# Patient Record
Sex: Female | Born: 1960 | Race: White | Hispanic: No | Marital: Married | Smoking: Never smoker
Health system: Southern US, Community
[De-identification: ages and names within clinical notes are randomized; demographics above are authoritative.]

---

## 1998-06-10 HISTORY — PX: BREAST SURGERY: SHX581

## 2003-06-11 HISTORY — PX: ENDOMETRIAL ABLATION: SHX621

## 2006-08-04 ENCOUNTER — Inpatient Hospital Stay: Payer: Self-pay | Admitting: Unknown Physician Specialty

## 2006-08-04 ENCOUNTER — Other Ambulatory Visit: Payer: Self-pay

## 2009-04-12 ENCOUNTER — Ambulatory Visit: Payer: Self-pay | Admitting: Family Medicine

## 2009-11-09 ENCOUNTER — Ambulatory Visit: Payer: Self-pay | Admitting: Obstetrics and Gynecology

## 2009-11-16 ENCOUNTER — Ambulatory Visit: Payer: Self-pay | Admitting: Obstetrics and Gynecology

## 2009-11-24 ENCOUNTER — Ambulatory Visit: Payer: Self-pay | Admitting: Obstetrics and Gynecology

## 2009-11-30 ENCOUNTER — Ambulatory Visit: Payer: Self-pay | Admitting: Obstetrics and Gynecology

## 2012-05-27 ENCOUNTER — Ambulatory Visit: Payer: Self-pay | Admitting: Orthopedic Surgery

## 2012-05-29 ENCOUNTER — Ambulatory Visit: Payer: Self-pay | Admitting: Obstetrics and Gynecology

## 2013-04-29 IMAGING — MG MM CAD SCREENING MAMMO
1 series · 4 of 4 positions shown · non-contrast
Comparison: none

REASON FOR EXAM: SCR MAMMO NO ORDER
COMMENTS:

[R CC · right · 4 of 4 slices shown]
[im 1/4]
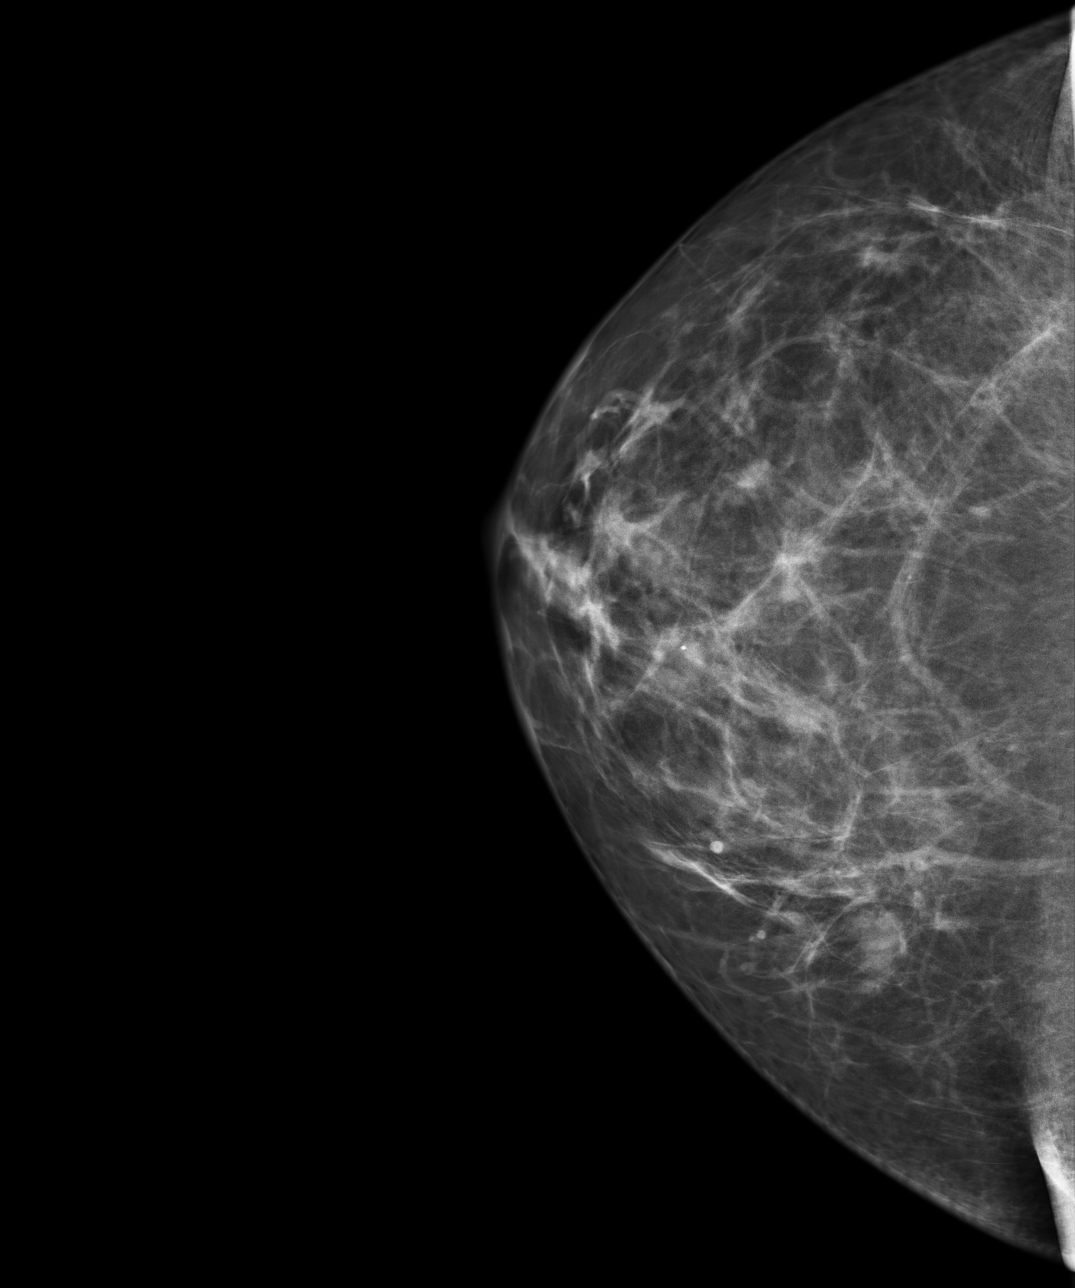
[im 2/4]
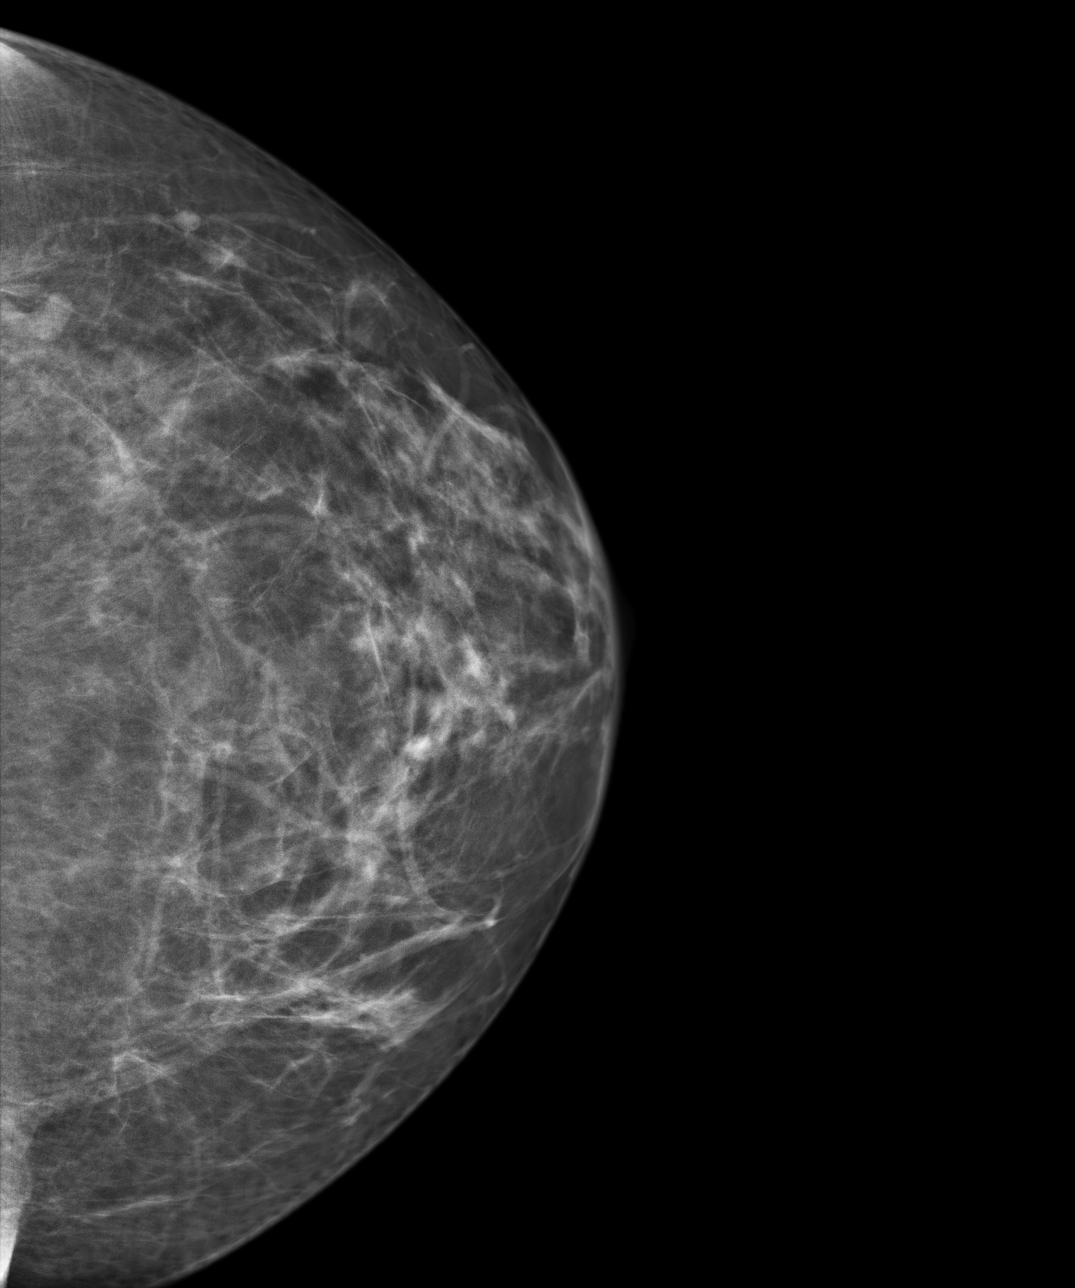
[im 3/4]
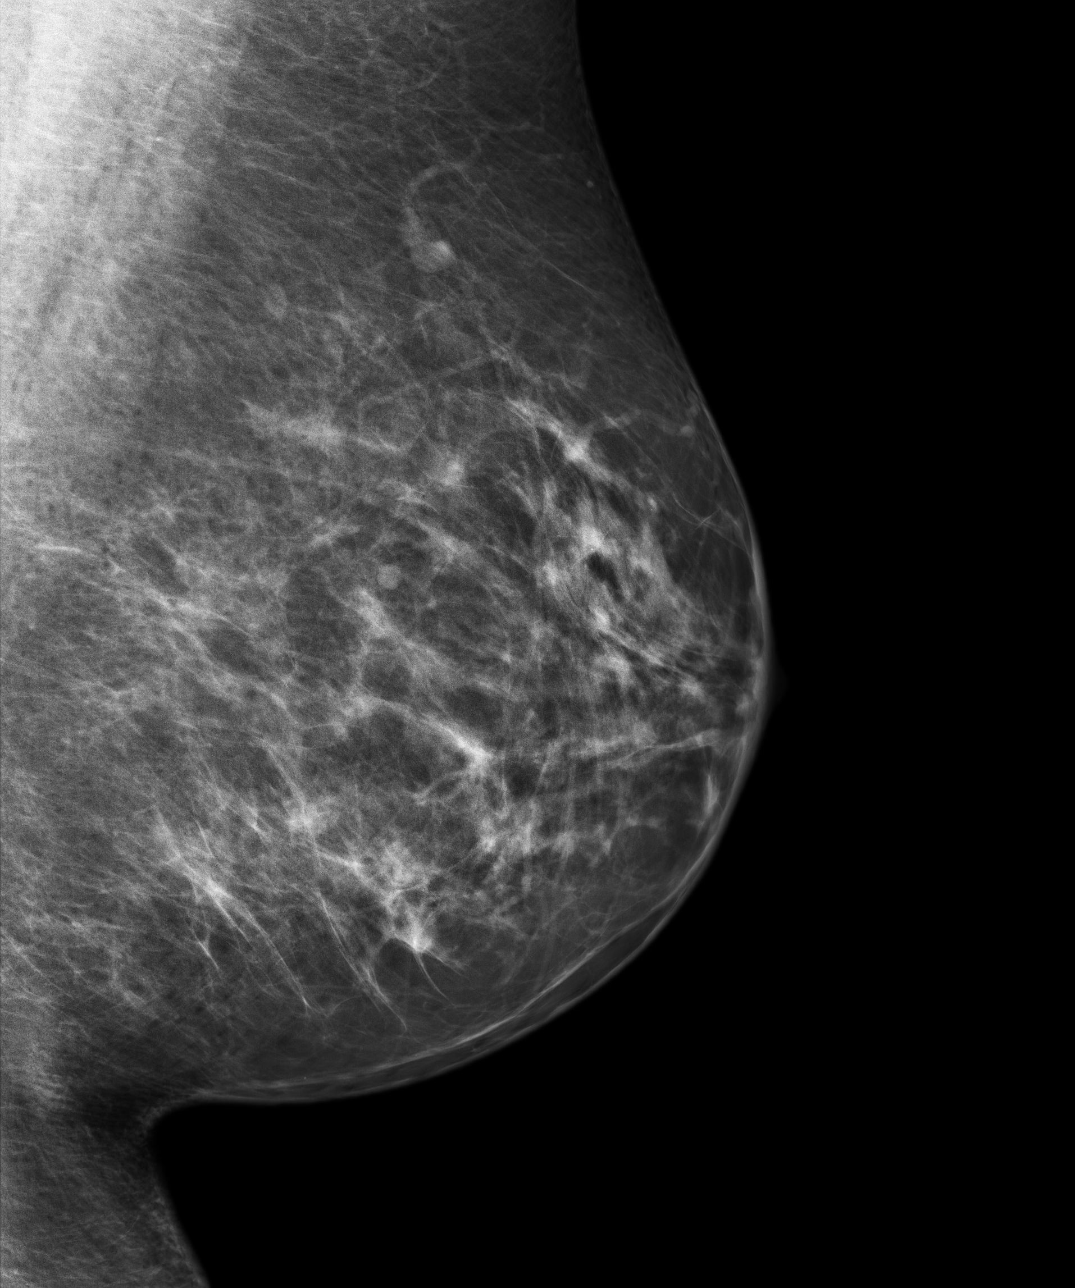
[im 4/4]
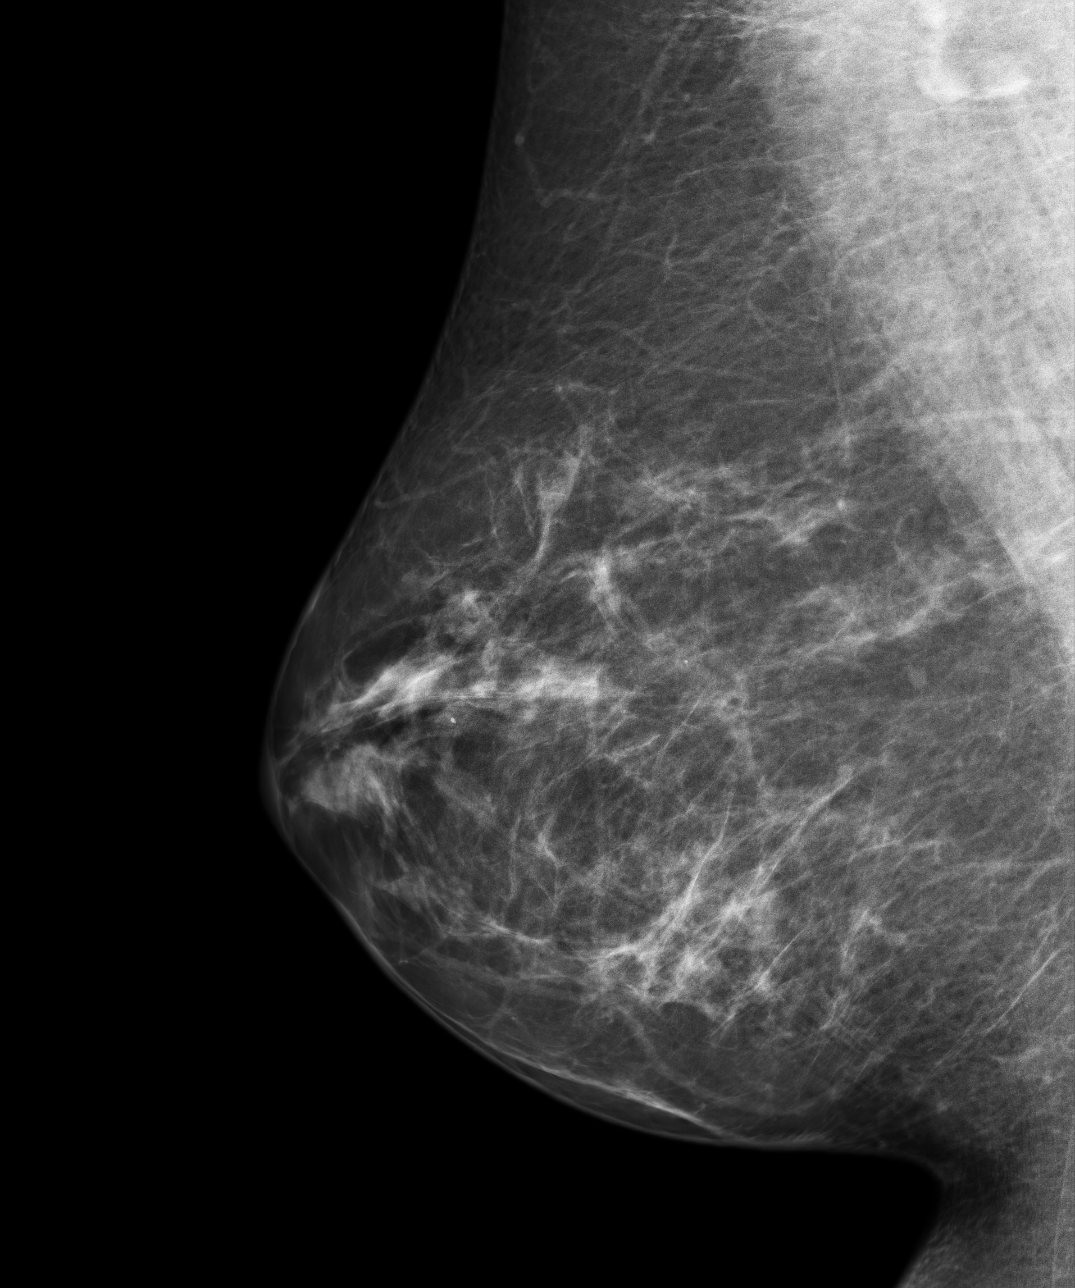

[4 of 4 positions shown; findings below may reference images not displayed]

PROCEDURE:     MAM - MAM DGTL SCRN MAM NO ORDER W/CAD  - May 29, 2012 [DATE]

RESULT:     There is a family history of breast cancer in the patient's
mother and two maternal aunts. The patient has undergone breast reduction in
5555. Comparison is made to the previous analog images dated 16 March, 2001
and to digital mammographic images from 12 April, 2009, as well as 20 November, 2005. There is a moderately dense heterogeneous parenchymal pattern without
a dominant mass, malignant calcification or architectural distortion. The
mammographic appearance is stable.
IMPRESSION: 1. Stable, benign appearing bilateral mammogram. Please continue to
encourage annual mammographic follow-up and monthly breast self exam.

Breast density: BI-RADS type 2

BI-RADS: Category 2 - Benign Finding.

A NEGATIVE MAMMOGRAM REPORT DOES NOT PRECLUDE BIOPSY OR OTHER EVALUATION OF
A CLINICALLY PALPABLE OR OTHERWISE SUSPICIOUS MASS OR LESION. BREAST CANCER
MAY NOT BE DETECTED BY MAMMOGRAPHY IN UP TO 10% OF CASES.

[REDACTED]

## 2014-04-04 ENCOUNTER — Ambulatory Visit: Payer: Self-pay | Admitting: Podiatry

## 2015-07-24 ENCOUNTER — Ambulatory Visit (INDEPENDENT_AMBULATORY_CARE_PROVIDER_SITE_OTHER): Payer: Medicare Other | Admitting: Sports Medicine

## 2015-07-24 ENCOUNTER — Encounter: Payer: Self-pay | Admitting: Sports Medicine

## 2015-07-24 DIAGNOSIS — S91209A Unspecified open wound of unspecified toe(s) with damage to nail, initial encounter: Secondary | ICD-10-CM

## 2015-07-24 DIAGNOSIS — M79674 Pain in right toe(s): Secondary | ICD-10-CM

## 2015-07-24 NOTE — Progress Notes (Deleted)
   Subjective:    Patient ID: Tiffany Price, female    DOB: 1960/07/12, 55 y.o.   MRN: [id number]  HPI    Review of Systems  All other systems reviewed and are negative.      Objective:   Physical Exam        Assessment & Plan:

## 2015-07-24 NOTE — Patient Instructions (Signed)

## 2015-07-24 NOTE — Progress Notes (Signed)
Patient ID: Tiffany Price, female   DOB: [birth year], 55 y.o.   MRN: [id number] Subjective: Tiffany Price is a 55 y.o.  female patient presents to office today complaining of a painful right hallux toenail that is coming off; reports that on yesterday she accidentally snagged the nail after her sons had stepped on her toe; states that she has had problems with her nail since 2012 after getting a pedicure and the nail always has been brown to grey in color. Patient denies fever/chills/nausea/vomitting/any other related constitutional symptoms at this time.  There are no active problems to display for this patient.  No current outpatient prescriptions on file prior to visit.   No current facility-administered medications on file prior to visit.   Allergies  Allergen Reactions  . Cymbalta [Duloxetine Hcl] Other (See Comments)  . Tetracyclines & Related   . Latex     Internally       Objective:   General: Well developed, nourished, in no acute distress, alert and oriented x3   Dermatology: Skin is warm, dry and supple bilateral.Right hallux nail appears to be partially attached with thickening, discoloration, and subungal debris. (-) Erythema. (-) Edema. (-) serosanguous drainage present. The remaining nails appear unremarkable at this time. There are no open sores, lesions or other signs of infection  present.  Vascular: Dorsalis Pedis artery 2/4 and Posterior Tibial artery pedal pulses are 1/4 bilateral with immedate capillary fill time. Pedal hair growth present. No lower extremity edema.   Neruologic: Grossly intact via light touch bilateral.  Musculoskeletal: Tenderness to palpation of the Right hallux. Muscular strength within normal limits in all groups bilateral.   Assesement and Plan: Problem List Items Addressed This Visit    None    Visit Diagnoses    Toenail avulsion, initial encounter    -  Primary    Partially avulsed Right hallux toenail after traumatically pulling  nail off    Toe pain, right           -Discussed treatment alternatives and plan of care; Explained permanent/temporary nail avulsion and post procedure course to patient. Patient opt for temporary complete avulsion of the right hallux nail.  - After a verbal consent, injected 3 ml of a 50:50 mixture of 2% plain lidocaine and 0.5% plain marcaine in a normal hallux block fashion. Next, a betadine prep was performed. Anesthesia was tested and found to be appropriate.  The Right hallux nail was then incised from the hyponychium to the epinychium. The entire nail was removed and cleared from the field. The area was then flushed with alcohol and dressed with antibiotic cream and a dry sterile dressing. -Patient was instructed to leave the dressing intact for today and begin soaking in a weak solution of betadine and water tomorrow. Patient was instructed to soak for 15 minutes each day and apply neosporin and a gauze or bandaid dressing each day. -Patient was instructed to monitor the toe for signs of infection and return to office if toe becomes red, hot or swollen. -Patient is to return in 1 week for follow up care or sooner if problems arise.  Landis Martins, DPM

## 2015-08-01 ENCOUNTER — Ambulatory Visit (INDEPENDENT_AMBULATORY_CARE_PROVIDER_SITE_OTHER): Payer: Medicare Other | Admitting: Sports Medicine

## 2015-08-01 ENCOUNTER — Encounter: Payer: Self-pay | Admitting: Sports Medicine

## 2015-08-01 DIAGNOSIS — M79674 Pain in right toe(s): Secondary | ICD-10-CM

## 2015-08-01 DIAGNOSIS — S91209D Unspecified open wound of unspecified toe(s) with damage to nail, subsequent encounter: Secondary | ICD-10-CM

## 2015-08-01 NOTE — Progress Notes (Signed)
Patient ID: Tiffany Price, female   DOB: August 22, 1960, 55 y.o.   MRN: [id number] Subjective: Tiffany Price is a 55 y.o.  female patient returns to office today for follow up evaluation after having Right Hallux total temporary nail avulsion performed on 07-24-15 secondary to snagging the nail and it partially coming off. Patient has been soaking using betadine and applying topical antibiotic covered with bandaid daily. Admits to pain with direct pressure. Patient deniesfever/chills/nausea/vomitting/any other related constitutional symptoms at this time.  There are no active problems to display for this patient.  Current Outpatient Prescriptions on File Prior to Visit  Medication Sig Dispense Refill  . amphetamine-dextroamphetamine (ADDERALL) 20 MG tablet Take 20 mg by mouth 4 (four) times daily.  0  . clonazePAM (KLONOPIN) 2 MG tablet TAKE 1 TABLET BY MOUTH 3 TIMES DAY AS NEEDED  2  . cyclobenzaprine (FLEXERIL) 10 MG tablet Take 10 mg by mouth 2 (two) times daily.  0  . phentermine (ADIPEX-P) 37.5 MG tablet     . tinidazole (TINDAMAX) 500 MG tablet Take 2,000 mg by mouth daily.  0  . topiramate (TOPAMAX) 200 MG tablet     . TRANSDERM-SCOP, 1.5 MG, 1 MG/3DAYS APPLY 1 PATCH TO SKIN EVERY 3 DAYS AS DIRECTED  0   No current facility-administered medications on file prior to visit.   Allergies  Allergen Reactions  . Cymbalta [Duloxetine Hcl] Other (See Comments)  . Tetracyclines & Related   . Latex     Internally      Objective:  General: Well developed, nourished, in no acute distress, alert and oriented x3   Dermatology: Skin is warm, dry and supple bilateral. Right hallux nail bed appears to be clean, dry, with mild granular tissue and surrounding eschar/scab. (-) Erythema. (-) Edema. (-) serosanguous drainage present. The remaining nails appear unremarkable at this time. There are no other lesions or other signs of infection present.  Neurovascular status: Intact. No lower extremity  swelling; No pain with calf compression bilateral.  Musculoskeletal: Decreased tenderness to palpation of the right hallux nail bed. Muscular strength within normal limits bilateral.   Assesement and Plan: Problem List Items Addressed This Visit    None    Visit Diagnoses    Toenail avulsion, subsequent encounter    -  Primary    S/P Avuslion procedure 07-24-15, healing well without complication    Toe pain, right           -Examined patient  -Cleansed right hallux nail bed and gently scrubbed with peroxide and applied antibiotic cream covered with bandaid.  -Discussed plan of care with patient. -Patient to now begin soaking in a weak solution of Epsom salt and warm water. Patient was instructed to soak for 15-20 minutes each day until the toe appears normal and there is no drainage, redness, tenderness, or swelling at the procedure site, and apply neosporin and a gauze or bandaid dressing each day as needed. May leave open to air at night. -Educated patient on long term care after nail surgery. -Patient was instructed to monitor the toe for reoccurrence and signs of infection; Patient advised to return to office if toe becomes red, hot or swollen. -Patient is to return as needed or sooner if problems arise.  Landis Martins, DPM

## 2015-08-04 ENCOUNTER — Ambulatory Visit: Payer: Medicare Other | Admitting: Sports Medicine

## 2017-04-21 ENCOUNTER — Encounter: Payer: Medicare Other | Admitting: Obstetrics & Gynecology

## 2017-05-06 ENCOUNTER — Encounter: Payer: Medicare Other | Admitting: Obstetrics & Gynecology

## 2017-05-13 ENCOUNTER — Encounter: Payer: Medicare Other | Admitting: Obstetrics & Gynecology

## 2017-09-03 ENCOUNTER — Ambulatory Visit: Payer: Medicare Other | Admitting: Podiatry

## 2017-10-21 DIAGNOSIS — F988 Other specified behavioral and emotional disorders with onset usually occurring in childhood and adolescence: Secondary | ICD-10-CM | POA: Insufficient documentation

## 2017-10-21 DIAGNOSIS — M5030 Other cervical disc degeneration, unspecified cervical region: Secondary | ICD-10-CM | POA: Insufficient documentation

## 2017-10-21 DIAGNOSIS — D4959 Neoplasm of unspecified behavior of other genitourinary organ: Secondary | ICD-10-CM | POA: Insufficient documentation

## 2017-10-21 DIAGNOSIS — E785 Hyperlipidemia, unspecified: Secondary | ICD-10-CM | POA: Insufficient documentation

## 2017-10-21 DIAGNOSIS — E669 Obesity, unspecified: Secondary | ICD-10-CM | POA: Insufficient documentation

## 2017-10-21 DIAGNOSIS — F419 Anxiety disorder, unspecified: Secondary | ICD-10-CM | POA: Insufficient documentation

## 2017-10-21 DIAGNOSIS — F3130 Bipolar disorder, current episode depressed, mild or moderate severity, unspecified: Secondary | ICD-10-CM | POA: Insufficient documentation

## 2017-10-21 DIAGNOSIS — Z780 Asymptomatic menopausal state: Secondary | ICD-10-CM | POA: Insufficient documentation

## 2018-09-04 ENCOUNTER — Other Ambulatory Visit: Payer: Self-pay

## 2018-09-08 ENCOUNTER — Ambulatory Visit: Payer: Medicare Other | Admitting: Obstetrics & Gynecology

## 2018-09-08 ENCOUNTER — Telehealth: Payer: Self-pay | Admitting: *Deleted

## 2018-09-08 ENCOUNTER — Other Ambulatory Visit: Payer: Self-pay

## 2018-09-08 ENCOUNTER — Encounter: Payer: Self-pay | Admitting: Obstetrics & Gynecology

## 2018-09-08 VITALS — BP 130/90 | Ht 66.0 in | Wt 226.0 lb

## 2018-09-08 DIAGNOSIS — Z803 Family history of malignant neoplasm of breast: Secondary | ICD-10-CM | POA: Diagnosis not present

## 2018-09-08 DIAGNOSIS — Z1151 Encounter for screening for human papillomavirus (HPV): Secondary | ICD-10-CM

## 2018-09-08 DIAGNOSIS — E6609 Other obesity due to excess calories: Secondary | ICD-10-CM | POA: Diagnosis not present

## 2018-09-08 DIAGNOSIS — Z01419 Encounter for gynecological examination (general) (routine) without abnormal findings: Secondary | ICD-10-CM | POA: Diagnosis not present

## 2018-09-08 DIAGNOSIS — Z6836 Body mass index (BMI) 36.0-36.9, adult: Secondary | ICD-10-CM

## 2018-09-08 DIAGNOSIS — E66812 Obesity, class 2: Secondary | ICD-10-CM

## 2018-09-08 DIAGNOSIS — N951 Menopausal and female climacteric states: Secondary | ICD-10-CM

## 2018-09-08 DIAGNOSIS — Z1211 Encounter for screening for malignant neoplasm of colon: Secondary | ICD-10-CM

## 2018-09-08 MED ORDER — BUPROPION HCL ER (XL) 150 MG PO TB24: 150.0000 mg | ORAL_TABLET | Freq: Every day | ORAL | 4 refills | Status: DC

## 2018-09-08 NOTE — Addendum Note (Signed)
Addended by: Thurnell Garbe A on: 09/08/2018 01:03 PM   Modules accepted: Orders

## 2018-09-08 NOTE — Telephone Encounter (Signed)
Referral placed in epic for both referral.   1. [facility] they will call to schedule 2. [facility] will call to schedule

## 2018-09-08 NOTE — Telephone Encounter (Signed)
-----   Message from Princess Bruins, MD sent at 09/08/2018 12:22 PM EDT ----- Regarding: Refer to Gastro/Refer to [facility] 1st screening [place].  Mother with Breast Cancer at 56+ yo.

## 2018-09-08 NOTE — Patient Instructions (Signed)
1. Encounter for routine gynecological examination with Papanicolaou smear of cervix Normal gynecologic exam.  Pap with high-risk HPV done today.  Breast exam normal.  Will schedule screening mammogram.  Refer to gastro for first screening colonoscopy.  Health labs with family physician.  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  Mother with breast cancer in her 57s, no genetic testing done.  Decision to start on Wellbutrin ER 150 mg daily to decrease the menopausal symptoms.  Usage reviewed and prescription sent to pharmacy.  Refer to [facility] genetic counseling.    3. Family history of breast cancer in mother Refer to [facility] genetic counseling.  4. Class 2 obesity due to excess calories without serious comorbidity with body mass index (BMI) of 36.0 to 36.9 in adult Recommend lower calorie/carb diet such as [employer] combined with intermittent fasting.  Counseling on intermittent fasting done.  Aerobic physical activities 5 times a week and weightlifting every 2 days.  Other orders - buPROPion (WELLBUTRIN XL) 150 MG 24 hr tablet; Take 1 tablet (150 mg total) by mouth daily.  Tiffany Price, it was a pleasure seeing you today!  I will inform you of your results as soon as they are available.

## 2018-09-08 NOTE — Progress Notes (Signed)
Tiffany Price 09-09-1960 [id number]   History:    58 y.o. [id number]  Divorcing.  Stable boyfriend for 3 years.  RP:  Established patient presenting for annual gyn exam   HPI: Menopause since 2017, very symptomatic with hot flashes and night sweats preventing a good night sleep.  No postmenopausal bleeding.  No pelvic pain.  Mother with history of breast cancer in her 72s.  No genetic screening done.  Breast normal.  No pain with intercourse.  Declines STD screening.  Body mass index 36.48.  Walking regularly.  Established with a family physician.  Health labs with family physician.  Followed by psychiatrist for bipolar disorder and anxiety.   Past medical history,surgical history, family history and social history were all reviewed and documented in the EPIC chart.  Gynecologic History No LMP recorded (lmp unknown). Patient is postmenopausal. Contraception: post menopausal status Last Pap: 12/2014. Results were: ASCUS/HPV HR neg Last mammogram: 12/2014.  Results were: Benign findings Bone Density: Never Colonoscopy: Will refer to Gastro  Obstetric History OB History  Gravida Para Term Preterm AB Living  5 2     3 2   SAB TAB Ectopic Multiple Live Births  3            # Outcome Date GA Lbr Len/2nd Weight Sex Delivery Anes PTL Lv  5 SAB           4 SAB           3 SAB           2 Para           1 Para              ROS: A ROS was performed and pertinent positives and negatives are included in the history.  GENERAL: No fevers or chills. HEENT: No change in vision, no earache, sore throat or sinus congestion. NECK: No pain or stiffness. CARDIOVASCULAR: No chest pain or pressure. No palpitations. PULMONARY: No shortness of breath, cough or wheeze. GASTROINTESTINAL: No abdominal pain, nausea, vomiting or diarrhea, melena or bright red blood per rectum. GENITOURINARY: No urinary frequency, urgency, hesitancy or dysuria. MUSCULOSKELETAL: No joint or muscle pain, no back pain, no recent  trauma. DERMATOLOGIC: No rash, no itching, no lesions. ENDOCRINE: No polyuria, polydipsia, no heat or cold intolerance. No recent change in weight. HEMATOLOGICAL: No anemia or easy bruising or bleeding. NEUROLOGIC: No headache, seizures, numbness, tingling or weakness. PSYCHIATRIC: No depression, no loss of interest in normal activity or change in sleep pattern.     Exam:   BP 130/90   Ht 5\' 6"  (1.676 m)   Wt 226 lb (102.5 kg)   LMP  (LMP Unknown)   BMI 36.48 kg/m   Body mass index is 36.48 kg/m.  General appearance : Well developed well nourished female. No acute distress HEENT: Eyes: no retinal hemorrhage or exudates,  Neck supple, trachea midline, no carotid bruits, no thyroidmegaly Lungs: Clear to auscultation, no rhonchi or wheezes, or rib retractions  Heart: Regular rate and rhythm, no murmurs or gallops Breast:Examined in sitting and supine position were symmetrical in appearance, no palpable masses or tenderness,  no skin retraction, no nipple inversion, no nipple discharge, no skin discoloration, no axillary or supraclavicular lymphadenopathy Abdomen: no palpable masses or tenderness, no rebound or guarding Extremities: no edema or skin discoloration or tenderness  Pelvic: Vulva: Normal             Vagina: No gross lesions or discharge  Cervix: No gross lesions or discharge.  Pap/HPV HR done.  Uterus  AV, normal size, shape and consistency, non-tender and mobile  Adnexa  Without masses or tenderness  Anus: Normal   Assessment/Plan:  58 y.o. female for annual exam   1. Encounter for routine gynecological examination with Papanicolaou smear of cervix Normal gynecologic exam.  Pap with high-risk HPV done today.  Breast exam normal.  Will schedule screening mammogram.  Refer to gastro for first screening colonoscopy.  Health labs with family physician.  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   Mother with breast cancer in her 68s, no genetic testing done.  Decision to start on Wellbutrin ER 150 mg daily to decrease the menopausal symptoms.  Usage reviewed and prescription sent to pharmacy.  Refer to [facility] genetic counseling.    3. Family history of breast cancer in mother Refer to [facility] genetic counseling.  4. Class 2 obesity due to excess calories without serious comorbidity with body mass index (BMI) of 36.0 to 36.9 in adult Recommend lower calorie/carb diet such as [employer] combined with intermittent fasting.  Counseling on intermittent fasting done.  Aerobic physical activities 5 times a week and weightlifting every 2 days.  Other orders - buPROPion (WELLBUTRIN XL) 150 MG 24 hr tablet; Take 1 tablet (150 mg total) by mouth daily.  Princess Bruins MD, 12:06 PM 09/08/2018

## 2018-09-09 ENCOUNTER — Telehealth: Payer: Self-pay | Admitting: Licensed Clinical Social Worker

## 2018-09-09 ENCOUNTER — Encounter: Payer: Self-pay | Admitting: Licensed Clinical Social Worker

## 2018-09-09 NOTE — Telephone Encounter (Signed)
A genetic counseling appt has been scheduled for the pt to see Faith Rogue on 6/18 at 1pm. Letter mailed.

## 2018-09-10 LAB — PAP, TP IMAGING W/ HPV RNA, RFLX HPV TYPE 16,18/45: HPV DNA HIGH RISK: NOT DETECTED

## 2018-09-15 NOTE — Telephone Encounter (Signed)
2. Patient scheduled on 11/26/18 @ 1:00pm

## 2018-11-06 ENCOUNTER — Telehealth: Payer: Self-pay | Admitting: *Deleted

## 2018-11-06 NOTE — Telephone Encounter (Signed)
Please relay the info from the counselor to patient.  We could do the genetic labs without the counseling.

## 2018-11-06 NOTE — Telephone Encounter (Signed)
Ok, thanks for the info.  Dr Dellis Filbert

## 2018-11-06 NOTE — Telephone Encounter (Signed)
Patient called and canceled her genetics appt, patient's insurance will not cover the appts. . Explained that I would send the genetic nurse a message.

## 2018-11-26 ENCOUNTER — Encounter: Payer: Self-pay | Admitting: Licensed Clinical Social Worker

## 2018-11-26 ENCOUNTER — Other Ambulatory Visit: Payer: Self-pay

## 2019-08-10 ENCOUNTER — Ambulatory Visit: Payer: Medicare Other | Admitting: Sports Medicine

## 2019-09-09 ENCOUNTER — Encounter: Payer: Medicare Other | Admitting: Obstetrics & Gynecology

## 2019-10-18 ENCOUNTER — Encounter: Payer: Self-pay | Admitting: Podiatry

## 2019-10-18 ENCOUNTER — Other Ambulatory Visit: Payer: Self-pay

## 2019-10-18 ENCOUNTER — Ambulatory Visit: Payer: Medicare PPO | Admitting: Podiatry

## 2019-10-18 DIAGNOSIS — L603 Nail dystrophy: Secondary | ICD-10-CM

## 2019-10-18 NOTE — Progress Notes (Signed)
  Subjective:  Patient ID: Tiffany Price, female    DOB: [birth year],  MRN: [id number] HPI Chief Complaint  Patient presents with  . Nail Problem    Hallux right - toenail discoloration since Oct. 2020, has been treated for nail fungus in the past and Dr. Cannon Kettle has removed the toenail from an injury before, tender at times and thinks may be just a bruise under the nail  . New Patient (Initial Visit)    Est pt 2017    59 y.o. female presents with the above complaint.   ROS: Denies fever chills nausea vomiting muscle aches pains calf pain back pain chest pain shortness of breath.  No past medical history on file. Past Surgical History:  Procedure Laterality Date  . BREAST SURGERY  2000   breast reduction  . ENDOMETRIAL ABLATION  2005    Current Outpatient Medications:  .  medroxyPROGESTERone (PROVERA) 2.5 MG tablet, Take 2.5 mg by mouth daily., Disp: , Rfl:  .  amphetamine-dextroamphetamine (ADDERALL) 20 MG tablet, , Disp: , Rfl:  .  clonazePAM (KLONOPIN) 2 MG tablet, , Disp: , Rfl:  .  estradiol (ESTRACE) 1 MG tablet, , Disp: , Rfl:  .  phentermine 37.5 MG capsule, , Disp: , Rfl:  .  topiramate (TOPAMAX) 200 MG tablet, , Disp: , Rfl:   Allergies  Allergen Reactions  . Cymbalta [Duloxetine Hcl] Other (See Comments)  . Tetracyclines & Related   . Latex     Internally    . Saphris [Asenapine] Other (See Comments)   Review of Systems Objective:  There were no vitals filed for this visit.  General: Well developed, nourished, in no acute distress, alert and oriented x3   Dermatological: Skin is warm, dry and supple bilateral. Nails x 10 are well maintained; remaining integument appears unremarkable at this time. There are no open sores, no preulcerative lesions, no rash or signs of infection present.  Discoloration to the distal medial aspect of the right hallux nail plate.  Dark brown and yellow with some thickening and subungual debris.  Vascular: Dorsalis Pedis artery and  Posterior Tibial artery pedal pulses are 2/4 bilateral with immedate capillary fill time. Pedal hair growth present. No varicosities and no lower extremity edema present bilateral.   Neruologic: Grossly intact via light touch bilateral. Vibratory intact via tuning fork bilateral. Protective threshold with Semmes Wienstein monofilament intact to all pedal sites bilateral. Patellar and Achilles deep tendon reflexes 2+ bilateral. No Babinski or clonus noted bilateral.   Musculoskeletal: No gross boney pedal deformities bilateral. No pain, crepitus, or limitation noted with foot and ankle range of motion bilateral. Muscular strength 5/5 in all groups tested bilateral.  Gait: Unassisted, Nonantalgic.    Radiographs:  None taken  Assessment & Plan:   Assessment: Nail dystrophy cannot rule out onychomycosis hallux right  Plan: Samples of skin and nail were taken today for pathologic evaluation follow-up with her in 1 month     Mirenda Baltazar T. [place], [place]

## 2019-11-04 ENCOUNTER — Telehealth: Payer: Self-pay

## 2019-11-04 NOTE — Telephone Encounter (Signed)
Patient has been notified of results.  

## 2019-11-04 NOTE — Telephone Encounter (Signed)
-----   Message from Garrel Ridgel, [place] sent at 11/01/2019  7:29 AM EDT ----- Positive for fungus.

## 2019-11-29 ENCOUNTER — Other Ambulatory Visit: Payer: Self-pay

## 2019-11-29 ENCOUNTER — Ambulatory Visit: Payer: Medicare PPO | Admitting: Podiatry

## 2019-11-29 ENCOUNTER — Encounter: Payer: Self-pay | Admitting: Podiatry

## 2019-11-29 DIAGNOSIS — L603 Nail dystrophy: Secondary | ICD-10-CM

## 2019-11-29 DIAGNOSIS — Z79899 Other long term (current) drug therapy: Secondary | ICD-10-CM | POA: Diagnosis not present

## 2019-11-29 MED ORDER — TERBINAFINE HCL 250 MG PO TABS: 250.0000 mg | ORAL_TABLET | Freq: Every day | ORAL | 0 refills | Status: DC

## 2019-11-29 NOTE — Patient Instructions (Signed)
Terbinafine tablets What is this medicine? TERBINAFINE (TER bin a feen) is an antifungal medicine. It is used to treat certain kinds of fungal or yeast infections. This medicine may be used for other purposes; ask your health care provider or pharmacist if you have questions. COMMON BRAND NAME(S): Lamisil, Terbinex What should I tell my health care provider before I take this medicine? They need to know if you have any of these conditions:  drink alcoholic beverages  kidney disease  liver disease  an unusual or allergic reaction to terbinafine, other medicines, foods, dyes, or preservatives  pregnant or trying to get pregnant  breast-feeding How should I use this medicine? Take this medicine by mouth with a full glass of water. Follow the directions on the prescription label. You can take this medicine with food or on an empty stomach. Take your medicine at regular intervals. Do not take your medicine more often than directed. Do not skip doses or stop your medicine early even if you feel better. Do not stop taking except on your doctor's advice. Talk to your pediatrician regarding the use of this medicine in children. Special care may be needed. Overdosage: If you think you have taken too much of this medicine contact a poison control center or emergency room at once. NOTE: This medicine is only for you. Do not share this medicine with others. What if I miss a dose? If you miss a dose, take it as soon as you can. If it is almost time for your next dose, take only that dose. Do not take double or extra doses. What may interact with this medicine? Do not take this medicine with any of the following medications:  thioridazine This medicine may also interact with the following medications:  beta-blockers  caffeine  cimetidine  cyclosporine  medicines for depression, anxiety, or psychotic disturbances  medicines for fungal infections like fluconazole and ketoconazole  medicines  for irregular heartbeat like amiodarone, flecainide and propafenone  rifampin  warfarin This list may not describe all possible interactions. Give your health care provider a list of all the medicines, herbs, non-prescription drugs, or dietary supplements you use. Also tell them if you smoke, drink alcohol, or use illegal drugs. Some items may interact with your medicine. What should I watch for while using this medicine? Visit your doctor or health care provider regularly. Tell your doctor right away if you have nausea or vomiting, loss of appetite, stomach pain on your right upper side, yellow skin, dark urine, light stools, or are over tired. Some fungal infections need many weeks or months of treatment to cure. If you are taking this medicine for a long time, you will need to have important blood work done. This medicine may cause serious skin reactions. They can happen weeks to months after starting the medicine. Contact your health care provider right away if you notice fevers or flu-like symptoms with a rash. The rash may be red or purple and then turn into blisters or peeling of the skin. Or, you might notice a red rash with swelling of the face, lips or lymph nodes in your neck or under your arms. What side effects may I notice from receiving this medicine? Side effects that you should report to your doctor or health care professional as soon as possible:  allergic reactions like skin rash or hives, swelling of the face, lips, or tongue  changes in vision  dark urine  fever or infection  general ill feeling or flu-like symptoms    light-colored stools  loss of appetite, nausea  rash, fever, and swollen lymph nodes  redness, blistering, peeling or loosening of the skin, including inside the mouth  right upper belly pain  unusually weak or tired  yellowing of the eyes or skin Side effects that usually do not require medical attention (report to your doctor or health care  professional if they continue or are bothersome):  changes in taste  diarrhea  hair loss  muscle or joint pain  stomach gas  stomach upset This list may not describe all possible side effects. Call your doctor for medical advice about side effects. You may report side effects to FDA at 1-800-FDA-1088. Where should I keep my medicine? Keep out of the reach of children. Store at room temperature below 25 degrees C (77 degrees F). Protect from light. Throw away any unused medicine after the expiration date. NOTE: This sheet is a summary. It may not cover all possible information. If you have questions about this medicine, talk to your doctor, pharmacist, or health care provider.  2020 Elsevier/Gold Standard (2018-09-04 15:37:07)  

## 2019-11-29 NOTE — Progress Notes (Signed)
She presents today for her pathology results regarding her toenails.  Objective: Vital signs are stable alert and oriented x3.  Pulses are palpable.  There is no erythema edema cellulitis drainage or odor no change in the nail plates as of yet.  However the pathology report does demonstrate onychomycosis.  Assessment: Onychomycosis dermatophyte.  Plan: After thorough discussion of the pros and cons of oral therapy topical therapy and laser therapy she has chosen to take oral therapy.  She is going to be started on Lamisil 250 mg tablets 1 tablet by mouth daily for 30 days.  We are requesting a liver profile and we will do another one in 30 days.  Should she have questions or concerns she will notify us immediately I will follow-up with her in 1 month.

## 2019-11-30 LAB — COMPREHENSIVE METABOLIC PANEL
ALT: 11 IU/L (ref 0–32)
AST: 13 IU/L (ref 0–40)
Albumin/Globulin Ratio: 2 (ref 1.2–2.2)
Albumin: 4.3 g/dL (ref 3.8–4.9)
Alkaline Phosphatase: 66 IU/L (ref 48–121)
BUN/Creatinine Ratio: 20 (ref 9–23)
BUN: 18 mg/dL (ref 6–24)
Bilirubin Total: 0.2 mg/dL (ref 0.0–1.2)
CO2: 20 mmol/L (ref 20–29)
Calcium: 9 mg/dL (ref 8.7–10.2)
Chloride: 110 mmol/L — ABNORMAL HIGH (ref 96–106)
Creatinine, Ser: 0.89 mg/dL (ref 0.57–1.00)
GFR calc Af Amer: 83 mL/min/{1.73_m2} (ref >59)
GFR calc non Af Amer: 72 mL/min/{1.73_m2} (ref >59)
Globulin, Total: 2.2 g/dL (ref 1.5–4.5)
Glucose: 108 mg/dL — ABNORMAL HIGH (ref 65–99)
Potassium: 4 mmol/L (ref 3.5–5.2)
Sodium: 140 mmol/L (ref 134–144)
Total Protein: 6.5 g/dL (ref 6.0–8.5)

## 2019-12-01 ENCOUNTER — Ambulatory Visit: Payer: Medicare PPO | Admitting: Dermatology

## 2019-12-01 ENCOUNTER — Encounter: Payer: Self-pay | Admitting: Dermatology

## 2019-12-01 ENCOUNTER — Other Ambulatory Visit: Payer: Self-pay

## 2019-12-01 DIAGNOSIS — T63304A Toxic effect of unspecified spider venom, undetermined, initial encounter: Secondary | ICD-10-CM

## 2019-12-01 DIAGNOSIS — L720 Epidermal cyst: Secondary | ICD-10-CM

## 2019-12-01 DIAGNOSIS — L719 Rosacea, unspecified: Secondary | ICD-10-CM | POA: Diagnosis not present

## 2019-12-01 MED ORDER — DOXYCYCLINE 40 MG PO CPDR: 40.0000 mg | DELAYED_RELEASE_CAPSULE | ORAL | 3 refills | Status: DC

## 2019-12-01 MED ORDER — SOOLANTRA 1 % EX CREA: TOPICAL_CREAM | CUTANEOUS | 3 refills | Status: DC

## 2019-12-01 NOTE — Patient Instructions (Addendum)
Pre-Operative Instructions  You are scheduled for a surgical procedure at [facility]. We recommend you read the following instructions. If you have any questions or concerns, please call the office at [phone].  1. Shower and wash the entire body with soap and water the day of your surgery paying special attention to cleansing at and around the planned surgery site.  2. Avoid aspirin or aspirin containing products at least fourteen (14) days prior to your surgical procedure and for at least one week (7 Days) after your surgical procedure. If you take aspirin on a regular basis for heart disease or history of stroke or for any other reason, we may recommend you continue taking aspirin but please notify us if you take this on a regular basis. Aspirin can cause more bleeding to occur during surgery as well as prolonged bleeding and bruising after surgery.   3. Avoid other nonsteroidal pain medications at least one week prior to surgery and at least one week prior to your surgery. These include medications such as Ibuprofen (Motrin, Advil and Nuprin), Naprosyn, Voltaren, Relafen, etc. If medications are used for therapeutic reasons, please inform us as they can cause increased bleeding or prolonged bleeding during and bruising after surgical procedures.   4. Please advice [place] if you are taking any "blood thinner" medications such as Coumadin or Dipyridamole or Plavix or similar medications. These cause increased bleeding and prolonged bleeding during and bruising after surgical procedures. We may have to consider discontinuing these medications briefly prior to and shortly after your surgery, if safe to do so.   5. Please inform us of all medications you are currently taking. All medications that are taken regularly should be taken the day of surgery as you always do. Nevertheless, we need to be informed of what medications you are taking prior to surgery to whether they will affect the  procedure or cause any complications.   6. Please inform us of any medication allergies. Also inform us of whether you have allergies to Latex or rubber products or whether you have had any adverse reaction to Lidocaine or Epinephrine.  7. Please inform us of any prosthetic or artificial body parts such as artificial heart valve, joint replacements, etc., or similar condition that might require preoperative antibiotics.   8. We recommend avoidance of alcohol at least two weeks prior to surgery and continued avoidence for at least two weeks after surgery.   9. We recommend discontinuation of tobacco smoking at least two weeks prior to surgery and continued abstinence for at least two weeks after surgery.  10. Do not plan strenuous exercise, strenuous work or strenuous lifting for approximately four weeks after your surgery.   11. We request if you are unable to make your scheduled surgical appointment, please call us at least a week in advance or as soon as you are aware of a problem sot aht we can cancel or reschedule you.   12. You MAKE TAKE TYLENOL (acetaminophen) for pain as it is not a blood thinner.   13. PLEASE PLAN TO BE IN TOWN FOR TWO WEEKS FOLLOWING SURGERY, THIS IS IMPORTANT SO YOU CAN BE CHECKED FOR DRESSING CHANGES, SUTURE REMOVAL AND TO MONITOR FOR POSSIBLE COMPLICATIONS.  Doxycycline should be taken with food to prevent nausea. Do not lay down for 30 minutes after taking. Be cautious with sun exposure and use good sun protection while on this medication. Pregnant women should not take this medication.

## 2019-12-01 NOTE — Progress Notes (Signed)
   Follow-Up Visit   Subjective  Tiffany Price is a 59 y.o. female who presents for the following: area of concern.  Patient presents today with several areas of concern: 1st rash on face, started on right cheek on B/L and on right neck since last March. 2nd is a cyst on her lower sternum x 1 year, it tends to leak and has a foul odor and gets irritated by her bra, and 3rd patient states that she has a spider bite on her left buttock x 2 days very painful to sit The following portions of the chart were reviewed this encounter and updated as appropriate:      Review of Systems:  No other skin or systemic complaints except as noted in HPI or Assessment and Plan.  Objective  Well appearing patient in no apparent distress; mood and affect are within normal limits.  A focused examination was performed including Face, chest and buttock. Relevant physical exam findings are noted in the Assessment and Plan.  Objective  Cheeks, nose, R neck: Erythema with scattered small inflammatory papules  Objective  central upper abdomen: 2 mm firm sub cutaneous nodule  Objective  Left Hip (side) - Posterior: 2 cm edematous pink firm pink nodule with 2 puncture marks in center with surrounding mild erythema (outlined today)   Assessment & Plan  Rosacea Cheeks, nose, R neck  Start Oracea 40 mg take 1 p.o. daily Start Soolantra Apply pea size amount to affected area on  Face and neck every night. Elta MD clear QAM   Ivermectin (SOOLANTRA) 1 % CREA - Cheeks, nose, R neck  doxycycline (ORACEA) 40 MG capsule - Cheeks, nose, R neck  Epidermal inclusion cyst central upper abdomen  Cyst with symptoms and/or recent change.  Discussed surgical excision to remove, including resulting scar and possible recurrence.  Patient will schedule for surgery. Pre-op information given.   Spider bite wound, undetermined intent, initial encounter Left Hip (side) - Posterior  Edges have been marked with  marker, pt will call if redness expanding and/or increasing pain Start Impoyz apply twice daily as needed- samples given Start cold compresses daily as needed Start Dorxy 200mg  , cut in half, take 1/2 tab p.o bid for six days  Return for schedule for cyst removal and f/u at suture removal.  I, Donzetta Kohut, CMA, am acting as scribe for Brendolyn Patty, MD .  Documentation: I have reviewed the above documentation for accuracy and completeness, and I agree with the above.  Brendolyn Patty MD

## 2019-12-01 NOTE — Progress Notes (Deleted)
   Follow-Up Visit   Subjective  Tiffany Price is a 59 y.o. female who presents for the following: area of concern.  Patient presents today with several areas of concern: 1st rash on face, started on right cheek on B/L and on right neck since last March. 2nd is a cyst on her lower sternum x 1 year, it tends to leak and has a foul odor, and 3rd patient states that she has a spider bite on her left buttock x 2 days very painful to sit The following portions of the chart were reviewed this encounter and updated as appropriate:      Review of Systems:  No other skin or systemic complaints except as noted in HPI or Assessment and Plan.  Objective  Well appearing patient in no apparent distress; mood and affect are within normal limits.  A focused examination was performed including Face, chest and buttock. Relevant physical exam findings are noted in the Assessment and Plan.  Objective  central upper abdomen: 2 mm firm sub cutaneous nodule  Objective  Left Hip (side) - Posterior: 2 cm with surrounding erythema, edematous pink firm pink nodule with 2 puncture wounds in center   Assessment & Plan  Rosacea Right Malar Cheek  Start Oracea 40 mg take 1 p.o. daily Start Soolantra Apply pea size amount to affected area on  Face and neck every night.   Ivermectin (SOOLANTRA) 1 % CREA - Right Malar Cheek  doxycycline (ORACEA) 40 MG capsule - Right Malar Cheek  Epidermal inclusion cyst central upper abdomen  Cyst with symptoms and/or recent change.  Discussed surgical excision to remove, including resulting scar and possible recurrence.  Patient will schedule for surgery. Pre-op information given.   Spider bite wound, undetermined intent, initial encounter Left Hip (side) - Posterior  Edges have been marked with marker Start Impoyz apply twice daily as needed Start cold compresses daily as needed Start Dorxy 200mg  , cut in half, take one have p.o for six days  Return for  schedule for cyst removal and f/u at suture removal.  I, Donzetta Kohut, CMA, am acting as scribe for Brendolyn Patty, MD .

## 2019-12-03 ENCOUNTER — Telehealth: Payer: Self-pay

## 2019-12-03 NOTE — Telephone Encounter (Signed)
Patient notified of normal results and instructed to start medication as directed by Dr. Milinda Pointer

## 2019-12-03 NOTE — Telephone Encounter (Signed)
-----   Message from Andres Ege, RN sent at 12/03/2019  7:52 AM EDT ----- Janace Hoard, Please assist pt. Marcy Siren ----- Message ----- From: Garrel Ridgel, [place] Sent: 11/30/2019   7:25 AM EDT To: Andres Ege, RN  Blood work looks good and may continue medication.

## 2019-12-06 ENCOUNTER — Telehealth: Payer: Self-pay

## 2019-12-06 MED ORDER — DOXYCYCLINE HYCLATE 20 MG PO TABS
20.0000 mg | ORAL_TABLET | Freq: Two times a day (BID) | ORAL | 2 refills | Status: AC
Start: 2019-12-06 — End: 2020-01-05

## 2019-12-06 MED ORDER — METRONIDAZOLE 0.75 % EX CREA
TOPICAL_CREAM | Freq: Two times a day (BID) | CUTANEOUS | 0 refills | Status: AC
Start: 2019-12-06 — End: 2020-12-05

## 2019-12-06 NOTE — Telephone Encounter (Signed)
Fax from pharmacy stating Soolantra and Doxycycline 40mg  is not covered.   Metronidazole is preferred first for patients insurance.  Change Doxycyline 40mg  to Doxycycline 20mg  BID?

## 2019-12-06 NOTE — Telephone Encounter (Signed)
Yes can send in Metrocream qd/bid to [facility], and if that isn't effective, we can do a prior authorization/appeal for the [facility] later.  Yes can change the Oracea to Doxy 20mg , take 2 tabs PO qd with food, #60

## 2019-12-06 NOTE — Addendum Note (Signed)
Addended by: Brendolyn Patty on: 12/06/2019 07:09 PM   Modules accepted: Level of Service

## 2019-12-06 NOTE — Telephone Encounter (Signed)
Prescriptions sent to pharmacy and patient advised of medication change.

## 2020-01-06 ENCOUNTER — Telehealth: Payer: Self-pay

## 2020-01-06 NOTE — Telephone Encounter (Signed)
The area will be numb for about an hour or so after the procedure, so she shouldn't be in any pain.  Technically, she should be able to make the drive, but if she has any concerns about how she will feel, she may want to get a driver for peace of mind.

## 2020-01-06 NOTE — Telephone Encounter (Signed)
Patient called and left message asking if she would be okay to drive home after her procedure on August 9th. The place being removed is in between her breast and she was worried about driving home (over an hour away). She knows she is not being put to sleep but was worried about the sutures. What do you advise?

## 2020-01-10 ENCOUNTER — Encounter: Payer: Self-pay | Admitting: Podiatry

## 2020-01-10 ENCOUNTER — Ambulatory Visit: Payer: Medicare PPO | Admitting: Podiatry

## 2020-01-10 ENCOUNTER — Other Ambulatory Visit: Payer: Self-pay

## 2020-01-10 DIAGNOSIS — L603 Nail dystrophy: Secondary | ICD-10-CM

## 2020-01-10 DIAGNOSIS — Z79899 Other long term (current) drug therapy: Secondary | ICD-10-CM | POA: Diagnosis not present

## 2020-01-10 MED ORDER — TERBINAFINE HCL 250 MG PO TABS: 250.0000 mg | ORAL_TABLET | Freq: Every day | ORAL | 0 refills | Status: DC

## 2020-01-10 NOTE — Telephone Encounter (Signed)
Patient advised of information per Dr. Nicole Kindred.

## 2020-01-10 NOTE — Telephone Encounter (Signed)
Left message for patient to return my call.

## 2020-01-10 NOTE — Progress Notes (Signed)
She presents today for follow-up of her nail fungus she is completed the first 30 days of medication.  She states that it made given her little GI distress but other than that she was just fine.  She denies fever chills nausea vomiting muscle aches pains calf pain back pain chest pain shortness of breath.  No change in the nails as of yet.  Assessment: Long-term therapy with onychomycosis and Lamisil.  Plan: Request a liver work-up today and started her on another dose of Lamisil 250 mg tablets she will take 1 tablet once daily.  And I will follow-up with her in 4 months she will call with questions or concerns.

## 2020-01-11 LAB — HEPATIC FUNCTION PANEL
ALT: 8 IU/L (ref 0–32)
AST: 13 IU/L (ref 0–40)
Albumin: 4.2 g/dL (ref 3.8–4.9)
Alkaline Phosphatase: 67 IU/L (ref 48–121)
Bilirubin Total: 0.3 mg/dL (ref 0.0–1.2)
Bilirubin, Direct: 0.1 mg/dL (ref 0.00–0.40)
Total Protein: 6.6 g/dL (ref 6.0–8.5)

## 2020-01-17 ENCOUNTER — Encounter: Payer: Medicare PPO | Admitting: Dermatology

## 2020-01-21 ENCOUNTER — Telehealth: Payer: Self-pay

## 2020-01-21 NOTE — Telephone Encounter (Signed)
-----   Message from Garrel Ridgel, [place] sent at 01/12/2020  6:35 AM EDT ----- Blood work looks good and may continue medication.

## 2020-01-21 NOTE — Telephone Encounter (Signed)
Patient notified of results and to continue medication via voice mail

## 2020-05-15 ENCOUNTER — Ambulatory Visit: Payer: Medicare PPO | Admitting: Podiatry

## 2020-05-15 ENCOUNTER — Other Ambulatory Visit: Payer: Self-pay

## 2020-05-15 ENCOUNTER — Encounter: Payer: Self-pay | Admitting: Podiatry

## 2020-05-15 DIAGNOSIS — L603 Nail dystrophy: Secondary | ICD-10-CM | POA: Diagnosis not present

## 2020-05-15 MED ORDER — CICLOPIROX 8 % EX SOLN
Freq: Every day | CUTANEOUS | 11 refills | Status: AC
Start: 2020-05-15 — End: ?

## 2020-05-15 NOTE — Progress Notes (Signed)
She completed 106 days of her Lamisil therapy before her hair started to fall out a large clumps.  Skin started to dry and cracked.  She has had multiple yeast infections.  She stopped taking the medication and things seem to start to get better.  Objective: Toenails have cleared by approximately 50%  Assessment: Resolving onychomycosis.  Plan: She is following up with her doctor for all of this within the next week or so.  I recommended that she never take this medication again.  Also recommended that she start ciclopirox topical since she has made such good headway with the oral which should not allow this to regress.

## 2020-07-03 ENCOUNTER — Ambulatory Visit: Payer: Medicare PPO | Admitting: Dermatology

## 2020-09-13 ENCOUNTER — Other Ambulatory Visit: Payer: Self-pay

## 2020-09-13 ENCOUNTER — Encounter: Payer: Self-pay | Admitting: Podiatry

## 2020-09-13 ENCOUNTER — Ambulatory Visit: Payer: Medicare PPO | Admitting: Podiatry

## 2020-09-13 DIAGNOSIS — L603 Nail dystrophy: Secondary | ICD-10-CM

## 2020-09-13 MED ORDER — TERBINAFINE HCL 250 MG PO TABS: 250.0000 mg | ORAL_TABLET | Freq: Every day | ORAL | 0 refills | Status: DC

## 2020-09-13 NOTE — Patient Instructions (Signed)
Dr. Hyatt has sent over a refill for Lamisil to your pharmacy today. The instructions on your bottle will say "take 1 tablet daily", however, he would like for you to take one pill every other day. He will follow up with you in 3 months to re-evaluate your toenails. 

## 2020-09-13 NOTE — Progress Notes (Signed)
She presents today for follow-up of her nail fungus hallux bilaterally she says is a lot looking a lot better even using the ciclopirox states that it has grown out of the majority of the way.  She states that I had a Covid infection then more than likely was what led to my hair loss I do not think it was the medication.  Objective: Vital signs are stable alert oriented x3.  Pulses are palpable.  Nail plates do demonstrate about 75 to 85% clearing at this point.  I do believe she would benefit from at least an every other day dose of the Lamisil.  Assessment: Long-term therapy with antifungal topical and oral appears to be resolving the onychomycosis of the hallux bilaterally.  Plan: At this point I recommend starting back on the Lamisil 1 tablet every other day for the next 60 days and I will follow-up with her in 3 months.  I also recommended that she continue to include ciclopirox.  Questions or concerns she will notify me.

## 2021-01-08 ENCOUNTER — Encounter: Payer: Medicare PPO | Admitting: Podiatry

## 2022-01-09 ENCOUNTER — Ambulatory Visit: Payer: Medicare PPO | Admitting: Dermatology

## 2022-01-09 ENCOUNTER — Encounter: Payer: Self-pay | Admitting: Dermatology

## 2022-01-09 DIAGNOSIS — R21 Rash and other nonspecific skin eruption: Secondary | ICD-10-CM | POA: Diagnosis not present

## 2022-01-09 DIAGNOSIS — L719 Rosacea, unspecified: Secondary | ICD-10-CM

## 2022-01-09 MED ORDER — IVERMECTIN 1 % EX CREA: TOPICAL_CREAM | CUTANEOUS | 3 refills | Status: AC

## 2022-01-09 MED ORDER — PIMECROLIMUS 1 % EX CREA: TOPICAL_CREAM | CUTANEOUS | 2 refills | Status: AC

## 2022-01-09 MED ORDER — DOXYCYCLINE MONOHYDRATE 100 MG PO CAPS: 100.0000 mg | ORAL_CAPSULE | Freq: Every day | ORAL | 2 refills | Status: AC

## 2022-01-09 NOTE — Progress Notes (Signed)
   Follow-Up Visit   Subjective  Tiffany Price is a 61 y.o. female who presents for the following: Rash (Face, back, upper arms, sides of breasts, top of scalp. Dur: 8 months. Unsure if bad rosacea flare or something else. Uses Alastin face wash and day time moisturizer. Was using No. 7 night cream, stopped 3 weeks ago. Not currently taking Oracea 40 mg, not using Soolantra. Uses Elta MD sunscreen. Itching badly. Has not been outside working in yard or out in sun).    The following portions of the chart were reviewed this encounter and updated as appropriate:      Review of Systems: No other skin or systemic complaints except as noted in HPI or Assessment and Plan.   Objective  Well appearing patient in no apparent distress; mood and affect are within normal limits.  A focused examination was performed including scalp, face, back, chest. Relevant physical exam findings are noted in the Assessment and Plan.  face, neck, arms Scaly erythematous papules and plaques +/- edema and vesiculation.   Nose, Cheeks Mid face erythema with telangiectasias +/- scattered inflammatory papules.    Assessment & Plan  Rash face, neck, arms  Contact dermatitis vs perioral dermatitis/rosacea  Stop Alastin products at this time. Stop all other facial products Start Vanicream products.  Return to office for patch testing. Order placed today.   Start Pimecrolimus cream twice daily to [facility].   pimecrolimus (ELIDEL) 1 % cream - face, neck, arms Apply twice daily to face and affected body areas as needed for itching.  Related Procedures Patch Test  Rosacea Nose, Cheeks  Chronic and persistent condition with duration or expected duration over one year. Condition is bothersome/symptomatic for patient. Currently flared.   Rosacea is a chronic progressive skin condition usually affecting the face of adults, causing redness and/or acne bumps. It is treatable but not curable. It sometimes  affects the eyes (ocular rosacea) as well. It may respond to topical and/or systemic medication and can flare with stress, sun exposure, alcohol, exercise and some foods.  Daily application of broad spectrum spf 30+ sunscreen to face is recommended to reduce flares.  Start doxycycline 100 mg PO qd Doxycycline should be taken with food to prevent nausea. Do not lay down for 30 minutes after taking. Be cautious with sun exposure and use good sun protection while on this medication. Pregnant women should not take this medication.    Start Soolantra cream qhs to face    doxycycline (MONODOX) 100 MG capsule - Nose, Cheeks Take 1 capsule (100 mg total) by mouth daily. Take with food  Ivermectin (SOOLANTRA) 1 % CREA - Nose, Cheeks Apply pea size amount to affected area on  Face and neck every night.  Related Medications doxycycline (ORACEA) 40 MG capsule Take 1 capsule (40 mg total) by mouth every morning.   Return for Patch Testing, this coming Monday afternoon, WITH Dr. Nicole Kindred.  I, Emelia Salisbury, CMA, am acting as scribe for Brendolyn Patty, MD.  Documentation: I have reviewed the above documentation for accuracy and completeness, and I agree with the above.  Brendolyn Patty MD

## 2022-01-09 NOTE — Patient Instructions (Addendum)
Start Pimecrolimus cream twice daily to face and body areas as needed for itching.   Stop Alastin products at this time.  Start Vanicream products.  Start Doxycycline 100 mg one capsule once daily with food.   Use Soolantra cream to affected areas on face for rosacea.   Doxycycline should be taken with food to prevent nausea. Do not lay down for 30 minutes after taking. Be cautious with sun exposure and use good sun protection while on this medication. Pregnant women should not take this medication.     Due to recent changes in healthcare laws, you may see results of your pathology and/or laboratory studies on MyChart before the doctors have had a chance to review them. We understand that in some cases there may be results that are confusing or concerning to you. Please understand that not all results are received at the same time and often the doctors may need to interpret multiple results in order to provide you with the best plan of care or course of treatment. Therefore, we ask that you please give [place] 2 business days to thoroughly review all your results before contacting the office for clarification. Should we see a critical lab result, you will be contacted sooner.   If You Need Anything After Your Visit  If you have any questions or concerns for your doctor, please call our main line at [phone] and press option 4 to reach your doctor's medical assistant. If no one answers, please leave a voicemail as directed and we will return your call as soon as possible. Messages left after 4 pm will be answered the following business day.   You may also send [place] a message via [place]. We typically respond to MyChart messages within 1-2 business days.  For prescription refills, please ask your pharmacy to contact our office. Our fax number is [phone].  If you have an urgent issue when the clinic is closed that cannot wait until the next business day, you can page your doctor at the number  below.    Please note that while we do our best to be available for urgent issues outside of office hours, we are not available 24/7.   If you have an urgent issue and are unable to reach [place], you may choose to seek medical care at your doctor's office, retail clinic, urgent care center, or emergency room.  If you have a medical emergency, please immediately call 911 or go to the emergency department.  Pager Numbers  - Dr. Nehemiah Massed: [phone]  - Dr. Laurence Ferrari: [phone]  - Dr. Nicole Kindred: [phone]  In the event of inclement weather, please call our main line at [phone] for an update on the status of any delays or closures.  Dermatology Medication Tips: Please keep the boxes that topical medications come in in order to help keep track of the instructions about where and how to use these. Pharmacies typically print the medication instructions only on the boxes and not directly on the medication tubes.   If your medication is too expensive, please contact our office at [phone] option 4 or send [place] a message through [place].   We are unable to tell what your co-pay for medications will be in advance as this is different depending on your insurance coverage. However, we may be able to find a substitute medication at lower cost or fill out paperwork to get insurance to cover a needed medication.   If a prior authorization is required to get your medication covered  by your insurance company, please allow [place] 1-2 business days to complete this process.  Drug prices often vary depending on where the prescription is filled and some pharmacies may offer cheaper prices.  The website www.goodrx.com contains coupons for medications through different pharmacies. The prices here do not account for what the cost may be with help from insurance (it may be cheaper with your insurance), but the website can give you the price if you did not use any insurance.  - You can print the associated coupon  and take it with your prescription to the pharmacy.  - You may also stop by our office during regular business hours and pick up a GoodRx coupon card.  - If you need your prescription sent electronically to a different pharmacy, notify our office through [facility] or by phone at [phone] option 4.     Si Usted Necesita Algo Despus de Su Visita  Tambin puede enviarnos un mensaje a travs de Pharmacist, community. Por lo general respondemos a los mensajes de MyChart en el transcurso de 1 a 2 das hbiles.  Para renovar recetas, por favor pida a su farmacia que se ponga en contacto con nuestra oficina. Harland Dingwall de fax es [place] [phone].  Si tiene un asunto urgente cuando la clnica est cerrada y que no puede esperar hasta el siguiente da hbil, puede llamar/localizar a su doctor(a) al nmero que aparece a continuacin.   Por favor, tenga en cuenta que aunque hacemos todo lo posible para estar disponibles para asuntos urgentes fuera del horario de [place], no estamos disponibles las 24 horas del da, los 7 das de la [place].   Si tiene un problema urgente y no puede comunicarse con nosotros, puede optar por buscar atencin mdica  en el consultorio de su doctor(a), en una clnica privada, en un centro de atencin urgente o en una sala de emergencias.  Si tiene Engineering geologist, por favor llame inmediatamente al 911 o vaya a la sala de emergencias.  Nmeros de bper  - Dr. Nehemiah Massed: [phone]  - Dra. Moye: [phone]  - Dra. Nicole Kindred: [phone]  En caso de inclemencias del [place], por favor llame a Johnsie Kindred principal al [phone] para una actualizacin sobre el [place] de cualquier retraso o cierre.  Consejos para la medicacin en dermatologa: Por favor, guarde las cajas en las que vienen los medicamentos de uso tpico para ayudarle a seguir las instrucciones sobre dnde y cmo usarlos. Las farmacias generalmente imprimen las instrucciones del medicamento  slo en las cajas y no directamente en los tubos del [place].   Si su medicamento es muy caro, por favor, pngase en contacto con Zigmund Daniel llamando al [phone] y presione la opcin 4 o envenos un mensaje a travs de Pharmacist, community.   No podemos decirle cul ser su copago por los medicamentos por adelantado ya que esto es diferente dependiendo de la cobertura de su seguro. Sin embargo, es posible que podamos encontrar un medicamento sustituto a Electrical engineer un formulario para que el seguro cubra el medicamento que se considera necesario.   Si se requiere una autorizacin previa para que su compaa de seguros [place] su medicamento, por favor permtanos de 1 a 2 das hbiles para completar este proceso.  Los precios de los medicamentos varan con frecuencia dependiendo del Environmental consultant de dnde se surte la receta y alguna farmacias pueden ofrecer precios ms baratos.  El sitio web www.goodrx.com tiene cupones para medicamentos de Airline pilot. Los precios aqu no  tienen en cuenta lo que podra costar con la ayuda del seguro (puede ser ms barato con su seguro), pero el sitio web puede darle el precio si no Field seismologist.  - Puede imprimir el cupn correspondiente y llevarlo con su receta a la farmacia.  - Tambin puede pasar por nuestra oficina durante el horario de atencin regular y Charity fundraiser una tarjeta de cupones de GoodRx.  - Si necesita que su receta se enve electrnicamente a una farmacia diferente, informe a nuestra oficina a travs de MyChart de [place] o por telfono llamando al [phone] y presione la opcin 4.

## 2022-01-14 ENCOUNTER — Ambulatory Visit: Payer: Medicare PPO | Admitting: Dermatology

## 2022-01-14 DIAGNOSIS — R21 Rash and other nonspecific skin eruption: Secondary | ICD-10-CM

## 2022-01-14 DIAGNOSIS — L719 Rosacea, unspecified: Secondary | ICD-10-CM | POA: Diagnosis not present

## 2022-01-14 MED ORDER — CLOBETASOL PROPIONATE 0.05 % EX SOLN: CUTANEOUS | 1 refills | Status: AC

## 2022-01-14 NOTE — Patient Instructions (Addendum)
Eczema Skin Care  Buy TWO 16oz jars of CeraVe moisturizing cream  [facility], [facility], [facility] (no prescription needed)  Costs about $15 per jar   Jar #1: Use as a moisturizer as needed. Can be applied to any area of the body. Use twice daily to unaffected areas.  Jar #2: Pour one 82m bottle of clobetasol 0.05% solution into jar, mix well. Label this jar to indicate the medication has been added. Use twice daily to affected areas. Do not apply to face, groin or underarms.  Moisturizer may burn or sting initially. Try for at least 4 weeks.   Topical steroids (such as triamcinolone, fluocinolone, fluocinonide, mometasone, clobetasol, halobetasol, betamethasone, hydrocortisone) can cause thinning and lightening of the skin if they are used for too long in the same area. Your physician has selected the right strength medicine for your problem and area affected on the body. Please use your medication only as directed by your physician to prevent side effects.    Doxycycline should be taken with food to prevent nausea. Do not lay down for 30 minutes after taking. Be cautious with sun exposure and use good sun protection while on this medication. Pregnant women should not take this medication.    Due to recent changes in healthcare laws, you may see results of your pathology and/or laboratory studies on MyChart before the doctors have had a chance to review them. We understand that in some cases there may be results that are confusing or concerning to you. Please understand that not all results are received at the same time and often the doctors may need to interpret multiple results in order to provide you with the best plan of care or course of treatment. Therefore, we ask that you please give u[place]2 business days to thoroughly review all your results before contacting the office for clarification. Should we see a critical lab result, you will be contacted sooner.   If You Need Anything After Your  Visit  If you have any questions or concerns for your doctor, please call our main line at [phone] press option 4 to reach your doctor's medical assistant. If no one answers, please leave a voicemail as directed and we will return your call as soon as possible. Messages left after 4 pm will be answered the following business day.   You may also send [place] message via [place] We typically respond to MyChart messages within 1-2 business days.  For prescription refills, please ask your pharmacy to contact our office. Our fax number is [phone]  If you have an urgent issue when the clinic is closed that cannot wait until the next business day, you can page your doctor at the number below.    Please note that while we do our best to be available for urgent issues outside of office hours, we are not available 24/7.   If you have an urgent issue and are unable to reach [place] you may choose to seek medical care at your doctor's office, retail clinic, urgent care center, or emergency room.  If you have a medical emergency, please immediately call 911 or go to the emergency department.  Pager Numbers  - Dr. KNehemiah Massed [phone] - Dr. MLaurence Ferrari [phone] - Dr. SNicole Kindred [phone] In the event of inclement weather, please call our main line at [phone] an update on the status of any delays or closures.  Dermatology Medication Tips: Please keep the boxes that topical medications come in in order  to help keep track of the instructions about where and how to use these. Pharmacies typically print the medication instructions only on the boxes and not directly on the medication tubes.   If your medication is too expensive, please contact our office at [phone] option 4 or send [place] a message through [place].   We are unable to tell what your co-pay for medications will be in advance as this is different depending on your insurance coverage. However, we may be able to find a  substitute medication at lower cost or fill out paperwork to get insurance to cover a needed medication.   If a prior authorization is required to get your medication covered by your insurance company, please allow [place] 1-2 business days to complete this process.  Drug prices often vary depending on where the prescription is filled and some pharmacies may offer cheaper prices.  The website www.goodrx.com contains coupons for medications through different pharmacies. The prices here do not account for what the cost may be with help from insurance (it may be cheaper with your insurance), but the website can give you the price if you did not use any insurance.  - You can print the associated coupon and take it with your prescription to the pharmacy.  - You may also stop by our office during regular business hours and pick up a GoodRx coupon card.  - If you need your prescription sent electronically to a different pharmacy, notify our office through [facility] or by phone at [phone] option 4.     Si Usted Necesita Algo Despus de Su Visita  Tambin puede enviarnos un mensaje a travs de Pharmacist, community. Por lo general respondemos a los mensajes de MyChart en el transcurso de 1 a 2 das hbiles.  Para renovar recetas, por favor pida a su farmacia que se ponga en contacto con nuestra oficina. Harland Dingwall de fax es [place] [phone].  Si tiene un asunto urgente cuando la clnica est cerrada y que no puede esperar hasta el siguiente da hbil, puede llamar/localizar a su doctor(a) al nmero que aparece a continuacin.   Por favor, tenga en cuenta que aunque hacemos todo lo posible para estar disponibles para asuntos urgentes fuera del horario de [place], no estamos disponibles las 24 horas del da, los 7 das de la [place].   Si tiene un problema urgente y no puede comunicarse con nosotros, puede optar por buscar atencin mdica  en el consultorio de su doctor(a), en una clnica privada, en un  centro de atencin urgente o en una sala de emergencias.  Si tiene Engineering geologist, por favor llame inmediatamente al 911 o vaya a la sala de emergencias.  Nmeros de bper  - Dr. Nehemiah Massed: [phone]  - Dra. Moye: [phone]  - Dra. Nicole Kindred: [phone]  En caso de inclemencias del [place], por favor llame a Johnsie Kindred principal al [phone] para una actualizacin sobre el [place] de cualquier retraso o cierre.  Consejos para la medicacin en dermatologa: Por favor, guarde las cajas en las que vienen los medicamentos de uso tpico para ayudarle a seguir las instrucciones sobre dnde y cmo usarlos. Las farmacias generalmente imprimen las instrucciones del medicamento slo en las cajas y no directamente en los tubos del [place].   Si su medicamento es muy caro, por favor, pngase en contacto con Zigmund Daniel llamando al [phone] y presione la opcin 4 o envenos un mensaje a travs de Pharmacist, community.   No podemos decirle cul ser su copago  por los medicamentos por adelantado ya que esto es diferente dependiendo de la cobertura de su seguro. Sin embargo, es posible que podamos encontrar un medicamento sustituto a Electrical engineer un formulario para que el seguro cubra el medicamento que se considera necesario.   Si se requiere una autorizacin previa para que su compaa de seguros [place] su medicamento, por favor permtanos de 1 a 2 das hbiles para completar este proceso.  Los precios de los medicamentos varan con frecuencia dependiendo del Environmental consultant de dnde se surte la receta y alguna farmacias pueden ofrecer precios ms baratos.  El sitio web www.goodrx.com tiene cupones para medicamentos de Airline pilot. Los precios aqu no tienen en cuenta lo que podra costar con la ayuda del seguro (puede ser ms barato con su seguro), pero el sitio web puede darle el precio si no utiliz Research scientist (physical sciences).  - Puede imprimir el cupn correspondiente y llevarlo con su receta  a la farmacia.  - Tambin puede pasar por nuestra oficina durante el horario de atencin regular y Charity fundraiser una tarjeta de cupones de GoodRx.  - Si necesita que su receta se enve electrnicamente a una farmacia diferente, informe a nuestra oficina a travs de MyChart de [place] o por telfono llamando al [phone] y presione la opcin 4.

## 2022-01-14 NOTE — Progress Notes (Signed)
Follow-Up Visit   Subjective  Tiffany Price is a 61 y.o. female who presents for the following: Rash (Arms, face, back. Patient here for Patch Test application. She has not started pimecrolimus cream yet, was awaiting approval from insurance. She is broken out on her back today, but back not itchy.).   The following portions of the chart were reviewed this encounter and updated as appropriate:       Review of Systems:  No other skin or systemic complaints except as noted in HPI or Assessment and Plan.  Objective  Well appearing patient in no apparent distress; mood and affect are within normal limits.  A focused examination was performed including face, arms, back. Relevant physical exam findings are noted in the Assessment and Plan.  face, back Diffuse light pink papules on back with xerosis; erythema on flexor forearms and upper arms. Pink scaly patches on cheeks/jaw  face Mid face erythema with telangiectasias +/- scattered inflammatory papules.     Assessment & Plan  Rash face, back  Chronic and persistent condition with duration or expected duration over one year. Condition is bothersome/symptomatic for patient. Currently flared.   Patch Test not applied today due to rash on back. Will plan application on f/u once rash on back clear.  Start pimecrolimus cream Apply to AA face BID until rash improved. Approved by insurance, patient will pick up today.   Start Clobetasol/CeraVe mix Apply to AA rash BID until improved dsp 12m 1Rf. Avoid face, groin, axilla.   Topical steroids (such as triamcinolone, fluocinolone, fluocinonide, mometasone, clobetasol, halobetasol, betamethasone, hydrocortisone) can cause thinning and lightening of the skin if they are used for too long in the same area. Your physician has selected the right strength medicine for your problem and area affected on the body. Please use your medication only as directed by your physician to prevent side  effects.    clobetasol (TEMOVATE) 0.05 % external solution - face, back Patient to mix solution in jar of CeraVe Cream. Apply to affected areas rash twice daily until improved.  Related Medications pimecrolimus (ELIDEL) 1 % cream Apply twice daily to face and affected body areas as needed for itching.  Rosacea face  Chronic and persistent condition with duration or expected duration over one year. Condition is bothersome/symptomatic for patient. Currently flared.   Rosacea is a chronic progressive skin condition usually affecting the face of adults, causing redness and/or acne bumps. It is treatable but not curable. It sometimes affects the eyes (ocular rosacea) as well. It may respond to topical and/or systemic medication and can flare with stress, sun exposure, alcohol, exercise and some foods.  Daily application of broad spectrum spf 30+ sunscreen to face is recommended to reduce flares.  Continue doxycycline '100mg'$  take 1 po QD with food.  Continue ivermectin cream Apply qhs face..  Doxycycline should be taken with food to prevent nausea. Do not lay down for 30 minutes after taking. Be cautious with sun exposure and use good sun protection while on this medication. Pregnant women should not take this medication.    Related Medications doxycycline (ORACEA) 40 MG capsule Take 1 capsule (40 mg total) by mouth every morning.  doxycycline (MONODOX) 100 MG capsule Take 1 capsule (100 mg total) by mouth daily. Take with food  Ivermectin (SOOLANTRA) 1 % CREA Apply pea size amount to affected area on  Face and neck every night.   Return in about 2 weeks (around 01/28/2022) for Patch Test Application.  IMurray Hodgkins  Bobby Rumpf, CMA, am acting as scribe for Brendolyn Patty, MD .  Documentation: I have reviewed the above documentation for accuracy and completeness, and I agree with the above.  Brendolyn Patty MD

## 2022-01-28 ENCOUNTER — Ambulatory Visit: Payer: Medicare PPO | Admitting: Dermatology

## 2022-01-28 DIAGNOSIS — R21 Rash and other nonspecific skin eruption: Secondary | ICD-10-CM

## 2022-01-28 DIAGNOSIS — L719 Rosacea, unspecified: Secondary | ICD-10-CM | POA: Diagnosis not present

## 2022-01-28 NOTE — Progress Notes (Signed)
Follow-Up Visit   Subjective  Tiffany Price is a 61 y.o. female who presents for the following: Rash (2 week follow up on rash at face. Patient reports rash at face has improved since using cerave/clobetasol mix cream. She has not picked up Elidel cream yet, since just barely approved by insurance. She is not near as itchy and doesn't feel like anything is crawling under skin. ) and Rosacea (Patient reports has been using doxycycline and soolantra cream. ). Facial rash has improved.  Not as itchy.   The following portions of the chart were reviewed this encounter and updated as appropriate:      Review of Systems: No other skin or systemic complaints except as noted in HPI or Assessment and Plan.   Objective  Well appearing patient in no apparent distress; mood and affect are within normal limits.  A focused examination was performed including face, back, neck, . Relevant physical exam findings are noted in the Assessment and Plan.  face and back small light pink papules on back and light pink patches on right preauricular, earlobe, left jaw  Head - Anterior (Face) Mild erythema on nose and cheeks    Assessment & Plan  Rash and other nonspecific skin eruption face and back  Chronic and persistent condition with duration or expected duration over one year. Condition is bothersome/symptomatic for patient. Improving but not at goal   Patch Test not applied today due to rash still on back. Will plan application next 2-3 week f/u once rash on back clear.   Continue treatment another 2-3 weeks until clear   Continue pimecrolimus cream Apply to AA face and neck BID until rash improved. Approved by insurance, patient will pick up today.    Continue Clobetasol/CeraVe mix Apply to AA of back rash BID until improved dsp 22m 1Rf. Avoid face, groin, axilla.    Topical steroids (such as triamcinolone, fluocinolone, fluocinonide, mometasone, clobetasol, halobetasol, betamethasone,  hydrocortisone) can cause thinning and lightening of the skin if they are used for too long in the same area. Your physician has selected the right strength medicine for your problem and area affected on the body. Please use your medication only as directed by your physician to prevent side effects.     Rosacea Head - Anterior (Face)  Chronic and persistent condition with duration or expected duration over one year. Condition is bothersome/symptomatic for patient. Improving but not at goal   Rosacea is a chronic progressive skin condition usually affecting the face of adults, causing redness and/or acne bumps. It is treatable but not curable. It sometimes affects the eyes (ocular rosacea) as well. It may respond to topical and/or systemic medication and can flare with stress, sun exposure, alcohol, exercise and some foods.  Daily application of broad spectrum spf 30+ sunscreen to face is recommended to reduce flares.   Continue doxycycline '100mg'$  take 1 po QD with food.   Continue ivermectin cream Apply qhs face.   Doxycycline should be taken with food to prevent nausea. Do not lay down for 30 minutes after taking. Be cautious with sun exposure and use good sun protection while on this medication. Pregnant women should not take this medication.   Related Medications doxycycline (ORACEA) 40 MG capsule Take 1 capsule (40 mg total) by mouth every morning.  doxycycline (MONODOX) 100 MG capsule Take 1 capsule (100 mg total) by mouth daily. Take with food  Ivermectin (SOOLANTRA) 1 % CREA Apply pea size amount to affected area on  Face and neck every night.   Return in about 3 weeks (around 02/18/2022) for patch testing and recheck rash at back and face . I, Ruthell Rummage, CMA, am acting as scribe for Brendolyn Patty, MD.  Documentation: I have reviewed the above documentation for accuracy and completeness, and I agree with the above.  Brendolyn Patty MD

## 2022-01-28 NOTE — Patient Instructions (Addendum)
Continue pimecrolimus twice daily to areas on face and neck for rash Continue Cerave/clobetasol cream mix apply topically to back for rash twice daily   Continue doxycycline 100 mg by mouth daily Continue soolantra cream as directed   Doxycycline should be taken with food to prevent nausea. Do not lay down for 30 minutes after taking. Be cautious with sun exposure and use good sun protection while on this medication. Pregnant women should not take this medication.     Due to recent changes in healthcare laws, you may see results of your pathology and/or laboratory studies on MyChart before the doctors have had a chance to review them. We understand that in some cases there may be results that are confusing or concerning to you. Please understand that not all results are received at the same time and often the doctors may need to interpret multiple results in order to provide you with the best plan of care or course of treatment. Therefore, we ask that you please give [place] 2 business days to thoroughly review all your results before contacting the office for clarification. Should we see a critical lab result, you will be contacted sooner.   If You Need Anything After Your Visit  If you have any questions or concerns for your doctor, please call our main line at [phone] and press option 4 to reach your doctor's medical assistant. If no one answers, please leave a voicemail as directed and we will return your call as soon as possible. Messages left after 4 pm will be answered the following business day.   You may also send [place] a message via [place]. We typically respond to MyChart messages within 1-2 business days.  For prescription refills, please ask your pharmacy to contact our office. Our fax number is [phone].  If you have an urgent issue when the clinic is closed that cannot wait until the next business day, you can page your doctor at the number below.    Please note that while we  do our best to be available for urgent issues outside of office hours, we are not available 24/7.   If you have an urgent issue and are unable to reach [place], you may choose to seek medical care at your doctor's office, retail clinic, urgent care center, or emergency room.  If you have a medical emergency, please immediately call 911 or go to the emergency department.  Pager Numbers  - Dr. Nehemiah Massed: [phone]  - Dr. Laurence Ferrari: [phone]  - Dr. Nicole Kindred: [phone]  In the event of inclement weather, please call our main line at [phone] for an update on the status of any delays or closures.  Dermatology Medication Tips: Please keep the boxes that topical medications come in in order to help keep track of the instructions about where and how to use these. Pharmacies typically print the medication instructions only on the boxes and not directly on the medication tubes.   If your medication is too expensive, please contact our office at [phone] option 4 or send [place] a message through [place].   We are unable to tell what your co-pay for medications will be in advance as this is different depending on your insurance coverage. However, we may be able to find a substitute medication at lower cost or fill out paperwork to get insurance to cover a needed medication.   If a prior authorization is required to get your medication covered by your insurance company, please allow [place] 1-2 business days to  complete this process.  Drug prices often vary depending on where the prescription is filled and some pharmacies may offer cheaper prices.  The website www.goodrx.com contains coupons for medications through different pharmacies. The prices here do not account for what the cost may be with help from insurance (it may be cheaper with your insurance), but the website can give you the price if you did not use any insurance.  - You can print the associated coupon and take it with your prescription to  the pharmacy.  - You may also stop by our office during regular business hours and pick up a GoodRx coupon card.  - If you need your prescription sent electronically to a different pharmacy, notify our office through [facility] or by phone at [phone] option 4.     Si Usted Necesita Algo Despus de Su Visita  Tambin puede enviarnos un mensaje a travs de Pharmacist, community. Por lo general respondemos a los mensajes de MyChart en el transcurso de 1 a 2 das hbiles.  Para renovar recetas, por favor pida a su farmacia que se ponga en contacto con nuestra oficina. Harland Dingwall de fax es [place] [phone].  Si tiene un asunto urgente cuando la clnica est cerrada y que no puede esperar hasta el siguiente da hbil, puede llamar/localizar a su doctor(a) al nmero que aparece a continuacin.   Por favor, tenga en cuenta que aunque hacemos todo lo posible para estar disponibles para asuntos urgentes fuera del horario de [place], no estamos disponibles las 24 horas del da, los 7 das de la [place].   Si tiene un problema urgente y no puede comunicarse con nosotros, puede optar por buscar atencin mdica  en el consultorio de su doctor(a), en una clnica privada, en un centro de atencin urgente o en una sala de emergencias.  Si tiene Engineering geologist, por favor llame inmediatamente al 911 o vaya a la sala de emergencias.  Nmeros de bper  - Dr. Nehemiah Massed: [phone]  - Dra. Moye: [phone]  - Dra. Nicole Kindred: [phone]  En caso de inclemencias del [place], por favor llame a Johnsie Kindred principal al [phone] para una actualizacin sobre el [place] de cualquier retraso o cierre.  Consejos para la medicacin en dermatologa: Por favor, guarde las cajas en las que vienen los medicamentos de uso tpico para ayudarle a seguir las instrucciones sobre dnde y cmo usarlos. Las farmacias generalmente imprimen las instrucciones del medicamento slo en las cajas y no directamente en  los tubos del [place].   Si su medicamento es muy caro, por favor, pngase en contacto con Zigmund Daniel llamando al [phone] y presione la opcin 4 o envenos un mensaje a travs de Pharmacist, community.   No podemos decirle cul ser su copago por los medicamentos por adelantado ya que esto es diferente dependiendo de la cobertura de su seguro. Sin embargo, es posible que podamos encontrar un medicamento sustituto a Electrical engineer un formulario para que el seguro cubra el medicamento que se considera necesario.   Si se requiere una autorizacin previa para que su compaa de seguros [place] su medicamento, por favor permtanos de 1 a 2 das hbiles para completar este proceso.  Los precios de los medicamentos varan con frecuencia dependiendo del Environmental consultant de dnde se surte la receta y alguna farmacias pueden ofrecer precios ms baratos.  El sitio web www.goodrx.com tiene cupones para medicamentos de Airline pilot. Los precios aqu no tienen en cuenta lo que podra costar con la ayuda del  seguro (puede ser ms barato con su seguro), pero el sitio web puede darle el precio si no Field seismologist.  - Puede imprimir el cupn correspondiente y llevarlo con su receta a la farmacia.  - Tambin puede pasar por nuestra oficina durante el horario de atencin regular y Charity fundraiser una tarjeta de cupones de GoodRx.  - Si necesita que su receta se enve electrnicamente a una farmacia diferente, informe a nuestra oficina a travs de MyChart de [place] o por telfono llamando al [phone] y presione la opcin 4.

## 2022-02-18 ENCOUNTER — Ambulatory Visit: Payer: Medicare PPO | Admitting: Dermatology

## 2022-02-18 DIAGNOSIS — R21 Rash and other nonspecific skin eruption: Secondary | ICD-10-CM

## 2022-02-18 DIAGNOSIS — L719 Rosacea, unspecified: Secondary | ICD-10-CM

## 2022-02-18 NOTE — Progress Notes (Signed)
   Follow-Up Visit   Subjective  Tiffany Price is a 61 y.o. female who presents for the following: Dermatitis (Face, neck, back, Elidel cr qd, Clobetasol/Cerave mix qd, pt concerned about rash she is thinking she is being injected with something by someone breaking into her home.) and Rosacea (Face, improved, Doxycycline '100mg'$  1 po qd, Ivermectin cr qd).   The following portions of the chart were reviewed this encounter and updated as appropriate:       Review of Systems:  No other skin or systemic complaints except as noted in HPI or Assessment and Plan.  Objective  Well appearing patient in no apparent distress; mood and affect are within normal limits.  A focused examination was performed including face, back, abdomen, arms. Relevant physical exam findings are noted in the Assessment and Plan.  face Light pink patch R temple, L preauricular  back Multiple small pink paps back         Assessment & Plan  Rosacea face  Chronic and persistent condition with duration or expected duration over one year. Condition is symptomatic/ bothersome to patient. Improving but not currently at goal.   Rosacea is a chronic progressive skin condition usually affecting the face of adults, causing redness and/or acne bumps. It is treatable but not curable. It sometimes affects the eyes (ocular rosacea) as well. It may respond to topical and/or systemic medication and can flare with stress, sun exposure, alcohol, exercise and some foods.  Daily application of broad spectrum spf 30+ sunscreen to face is recommended to reduce flares.  Cont Doxycycline '100mg'$  1 po qd with food and drink Cont Ivermectin cr qhs to face  Doxycycline should be taken with food to prevent nausea. Do not lay down for 30 minutes after taking. Be cautious with sun exposure and use good sun protection while on this medication. Pregnant women should not take this medication.    Related Medications doxycycline (MONODOX)  100 MG capsule Take 1 capsule (100 mg total) by mouth daily. Take with food  Ivermectin (SOOLANTRA) 1 % CREA Apply pea size amount to affected area on  Face and neck every night.  Rash back  Chronic and persistent condition with duration or expected duration over one year. Rash unchanged on back despite treatment. Not currently at goal.   Contact Derm vs other True Patch Test 36 applied today to abdomen, since back not clear  Cont Elidel cream qd prn itching/flares Cont Clobetasol/Cerave cr qd/bid prn flares, avoid f/g/a  Related Procedures Patch Test  Related Medications pimecrolimus (ELIDEL) 1 % cream Apply twice daily to face and affected body areas as needed for itching.  clobetasol (TEMOVATE) 0.05 % external solution Patient to mix solution in jar of CeraVe Cream. Apply to affected areas rash twice daily until improved.   Return in about 2 days (around 02/20/2022) for 2 day, 1wk f/u for True Patch Test.  I, Sonya Hupman, RMA, am acting as scribe for Brendolyn Patty, MD .  Documentation: I have reviewed the above documentation for accuracy and completeness, and I agree with the above.  Brendolyn Patty MD

## 2022-02-18 NOTE — Patient Instructions (Signed)
Due to recent changes in healthcare laws, you may see results of your pathology and/or laboratory studies on MyChart before the doctors have had a chance to review them. We understand that in some cases there may be results that are confusing or concerning to you. Please understand that not all results are received at the same time and often the doctors may need to interpret multiple results in order to provide you with the best plan of care or course of treatment. Therefore, we ask that you please give us 2 business days to thoroughly review all your results before contacting the office for clarification. Should we see a critical lab result, you will be contacted sooner.   If You Need Anything After Your Visit  If you have any questions or concerns for your doctor, please call our main line at [phone] and press option 4 to reach your doctor's medical assistant. If no one answers, please leave a voicemail as directed and we will return your call as soon as possible. Messages left after 4 pm will be answered the following business day.   You may also send us a message via MyChart. We typically respond to MyChart messages within 1-2 business days.  For prescription refills, please ask your pharmacy to contact our office. Our fax number is [phone].  If you have an urgent issue when the clinic is closed that cannot wait until the next business day, you can page your doctor at the number below.    Please note that while we do our best to be available for urgent issues outside of office hours, we are not available 24/7.   If you have an urgent issue and are unable to reach us, you may choose to seek medical care at your doctor's office, retail clinic, urgent care center, or emergency room.  If you have a medical emergency, please immediately call 911 or go to the emergency department.  Pager Numbers  - Dr. Kowalski: [phone]  - Dr. Moye: [phone]  - Dr. Stewart:  [phone]  In the event of inclement weather, please call our main line at [phone] for an update on the status of any delays or closures.  Dermatology Medication Tips: Please keep the boxes that topical medications come in in order to help keep track of the instructions about where and how to use these. Pharmacies typically print the medication instructions only on the boxes and not directly on the medication tubes.   If your medication is too expensive, please contact our office at [phone] option 4 or send us a message through MyChart.   We are unable to tell what your co-pay for medications will be in advance as this is different depending on your insurance coverage. However, we may be able to find a substitute medication at lower cost or fill out paperwork to get insurance to cover a needed medication.   If a prior authorization is required to get your medication covered by your insurance company, please allow us 1-2 business days to complete this process.  Drug prices often vary depending on where the prescription is filled and some pharmacies may offer cheaper prices.  The website www.goodrx.com contains coupons for medications through different pharmacies. The prices here do not account for what the cost may be with help from insurance (it may be cheaper with your insurance), but the website can give you the price if you did not use any insurance.  - You can print the associated coupon and take it with   your prescription to the pharmacy.  - You may also stop by our office during regular business hours and pick up a GoodRx coupon card.  - If you need your prescription sent electronically to a different pharmacy, notify our office through [place] MyChart or by phone at [phone] option 4.     Si Usted Necesita Algo Despus de Su Visita  Tambin puede enviarnos un mensaje a travs de MyChart. Por lo general respondemos a los mensajes de MyChart en el transcurso de 1 a 2  das hbiles.  Para renovar recetas, por favor pida a su farmacia que se ponga en contacto con nuestra oficina. Nuestro nmero de fax es el [phone].  Si tiene un asunto urgente cuando la clnica est cerrada y que no puede esperar hasta el siguiente da hbil, puede llamar/localizar a su doctor(a) al nmero que aparece a continuacin.   Por favor, tenga en cuenta que aunque hacemos todo lo posible para estar disponibles para asuntos urgentes fuera del horario de oficina, no estamos disponibles las 24 horas del da, los 7 das de la semana.   Si tiene un problema urgente y no puede comunicarse con nosotros, puede optar por buscar atencin mdica  en el consultorio de su doctor(a), en una clnica privada, en un centro de atencin urgente o en una sala de emergencias.  Si tiene una emergencia mdica, por favor llame inmediatamente al 911 o vaya a la sala de emergencias.  Nmeros de bper  - Dr. Kowalski: [phone]  - Dra. Moye: [phone]  - Dra. Stewart: [phone]  En caso de inclemencias del tiempo, por favor llame a nuestra lnea principal al [phone] para una actualizacin sobre el estado de cualquier retraso o cierre.  Consejos para la medicacin en dermatologa: Por favor, guarde las cajas en las que vienen los medicamentos de uso tpico para ayudarle a seguir las instrucciones sobre dnde y cmo usarlos. Las farmacias generalmente imprimen las instrucciones del medicamento slo en las cajas y no directamente en los tubos del medicamento.   Si su medicamento es muy caro, por favor, pngase en contacto con nuestra oficina llamando al [phone] y presione la opcin 4 o envenos un mensaje a travs de MyChart.   No podemos decirle cul ser su copago por los medicamentos por adelantado ya que esto es diferente dependiendo de la cobertura de su seguro. Sin embargo, es posible que podamos encontrar un medicamento sustituto a menor costo o llenar un formulario para que el  seguro cubra el medicamento que se considera necesario.   Si se requiere una autorizacin previa para que su compaa de seguros cubra su medicamento, por favor permtanos de 1 a 2 das hbiles para completar este proceso.  Los precios de los medicamentos varan con frecuencia dependiendo del lugar de dnde se surte la receta y alguna farmacias pueden ofrecer precios ms baratos.  El sitio web www.goodrx.com tiene cupones para medicamentos de diferentes farmacias. Los precios aqu no tienen en cuenta lo que podra costar con la ayuda del seguro (puede ser ms barato con su seguro), pero el sitio web puede darle el precio si no utiliz ningn seguro.  - Puede imprimir el cupn correspondiente y llevarlo con su receta a la farmacia.  - Tambin puede pasar por nuestra oficina durante el horario de atencin regular y recoger una tarjeta de cupones de GoodRx.  - Si necesita que su receta se enve electrnicamente a una farmacia diferente, informe a nuestra oficina a travs de MyChart de [place]   o por telfono llamando al [phone] y presione la opcin 4.  

## 2022-02-20 ENCOUNTER — Ambulatory Visit: Payer: Medicare PPO | Admitting: Dermatology

## 2022-02-20 DIAGNOSIS — L239 Allergic contact dermatitis, unspecified cause: Secondary | ICD-10-CM

## 2022-02-20 NOTE — Progress Notes (Signed)
   Follow-Up Visit   Subjective  Tiffany Price is a 61 y.o. female who presents for the following: Follow-up.  Patient presents for 2 day Patch Test follow-up.  The following portions of the chart were reviewed this encounter and updated as appropriate:       Review of Systems:  No other skin or systemic complaints except as noted in HPI or Assessment and Plan.  Objective  Well appearing patient in no apparent distress; mood and affect are within normal limits.  A focused examination was performed including abdomen. Relevant physical exam findings are noted in the Assessment and Plan.  abdomen Mild erythema at #26, Quinoline Mix questionable reaction.   T.R.U.E. Test     Row Name 02/20/22 1000           Panel Panel 1;Panel 2;Panel 3       26. Quinoline Mix 1                   Assessment & Plan  Allergic contact dermatitis, unspecified trigger abdomen  vs other  Patch Test True Test x 36 removed from abdomen today.  Questionable reaction to #26 Quinoline Mix.  Patient was given templates and instruction to read and document any reactions daily.           Return as scheduled.  IJamesetta Orleans, CMA, am acting as scribe for Brendolyn Patty, MD .  Documentation: I have reviewed the above documentation for accuracy and completeness, and I agree with the above.  Brendolyn Patty MD

## 2022-02-20 NOTE — Patient Instructions (Signed)
Due to recent changes in healthcare laws, you may see results of your pathology and/or laboratory studies on MyChart before the doctors have had a chance to review them. We understand that in some cases there may be results that are confusing or concerning to you. Please understand that not all results are received at the same time and often the doctors may need to interpret multiple results in order to provide you with the best plan of care or course of treatment. Therefore, we ask that you please give us 2 business days to thoroughly review all your results before contacting the office for clarification. Should we see a critical lab result, you will be contacted sooner.   If You Need Anything After Your Visit  If you have any questions or concerns for your doctor, please call our main line at [phone] and press option 4 to reach your doctor's medical assistant. If no one answers, please leave a voicemail as directed and we will return your call as soon as possible. Messages left after 4 pm will be answered the following business day.   You may also send us a message via MyChart. We typically respond to MyChart messages within 1-2 business days.  For prescription refills, please ask your pharmacy to contact our office. Our fax number is [phone].  If you have an urgent issue when the clinic is closed that cannot wait until the next business day, you can page your doctor at the number below.    Please note that while we do our best to be available for urgent issues outside of office hours, we are not available 24/7.   If you have an urgent issue and are unable to reach us, you may choose to seek medical care at your doctor's office, retail clinic, urgent care center, or emergency room.  If you have a medical emergency, please immediately call 911 or go to the emergency department.  Pager Numbers  - Dr. Kowalski: [phone]  - Dr. Moye: [phone]  - Dr. Stewart:  [phone]  In the event of inclement weather, please call our main line at [phone] for an update on the status of any delays or closures.  Dermatology Medication Tips: Please keep the boxes that topical medications come in in order to help keep track of the instructions about where and how to use these. Pharmacies typically print the medication instructions only on the boxes and not directly on the medication tubes.   If your medication is too expensive, please contact our office at [phone] option 4 or send us a message through MyChart.   We are unable to tell what your co-pay for medications will be in advance as this is different depending on your insurance coverage. However, we may be able to find a substitute medication at lower cost or fill out paperwork to get insurance to cover a needed medication.   If a prior authorization is required to get your medication covered by your insurance company, please allow us 1-2 business days to complete this process.  Drug prices often vary depending on where the prescription is filled and some pharmacies may offer cheaper prices.  The website www.goodrx.com contains coupons for medications through different pharmacies. The prices here do not account for what the cost may be with help from insurance (it may be cheaper with your insurance), but the website can give you the price if you did not use any insurance.  - You can print the associated coupon and take it with   your prescription to the pharmacy.  - You may also stop by our office during regular business hours and pick up a GoodRx coupon card.  - If you need your prescription sent electronically to a different pharmacy, notify our office through [facility] MyChart or by phone at [phone] option 4.     Si Usted Necesita Algo Despus de Su Visita  Tambin puede enviarnos un mensaje a travs de MyChart. Por lo general respondemos a los mensajes de MyChart en el transcurso de 1 a 2  das hbiles.  Para renovar recetas, por favor pida a su farmacia que se ponga en contacto con nuestra oficina. Nuestro nmero de fax es el [phone].  Si tiene un asunto urgente cuando la clnica est cerrada y que no puede esperar hasta el siguiente da hbil, puede llamar/localizar a su doctor(a) al nmero que aparece a continuacin.   Por favor, tenga en cuenta que aunque hacemos todo lo posible para estar disponibles para asuntos urgentes fuera del horario de oficina, no estamos disponibles las 24 horas del da, los 7 das de la semana.   Si tiene un problema urgente y no puede comunicarse con nosotros, puede optar por buscar atencin mdica  en el consultorio de su doctor(a), en una clnica privada, en un centro de atencin urgente o en una sala de emergencias.  Si tiene una emergencia mdica, por favor llame inmediatamente al 911 o vaya a la sala de emergencias.  Nmeros de bper  - Dr. Kowalski: [phone]  - Dra. Moye: [phone]  - Dra. Stewart: [phone]  En caso de inclemencias del tiempo, por favor llame a nuestra lnea principal al [phone] para una actualizacin sobre el estado de cualquier retraso o cierre.  Consejos para la medicacin en dermatologa: Por favor, guarde las cajas en las que vienen los medicamentos de uso tpico para ayudarle a seguir las instrucciones sobre dnde y cmo usarlos. Las farmacias generalmente imprimen las instrucciones del medicamento slo en las cajas y no directamente en los tubos del medicamento.   Si su medicamento es muy caro, por favor, pngase en contacto con nuestra oficina llamando al [phone] y presione la opcin 4 o envenos un mensaje a travs de MyChart.   No podemos decirle cul ser su copago por los medicamentos por adelantado ya que esto es diferente dependiendo de la cobertura de su seguro. Sin embargo, es posible que podamos encontrar un medicamento sustituto a menor costo o llenar un formulario para que el  seguro cubra el medicamento que se considera necesario.   Si se requiere una autorizacin previa para que su compaa de seguros cubra su medicamento, por favor permtanos de 1 a 2 das hbiles para completar este proceso.  Los precios de los medicamentos varan con frecuencia dependiendo del lugar de dnde se surte la receta y alguna farmacias pueden ofrecer precios ms baratos.  El sitio web www.goodrx.com tiene cupones para medicamentos de diferentes farmacias. Los precios aqu no tienen en cuenta lo que podra costar con la ayuda del seguro (puede ser ms barato con su seguro), pero el sitio web puede darle el precio si no utiliz ningn seguro.  - Puede imprimir el cupn correspondiente y llevarlo con su receta a la farmacia.  - Tambin puede pasar por nuestra oficina durante el horario de atencin regular y recoger una tarjeta de cupones de GoodRx.  - Si necesita que su receta se enve electrnicamente a una farmacia diferente, informe a nuestra oficina a travs de MyChart de    o por telfono llamando al [phone] y presione la opcin 4.  

## 2022-02-25 ENCOUNTER — Ambulatory Visit: Payer: Medicare PPO | Admitting: Dermatology

## 2022-02-25 DIAGNOSIS — L719 Rosacea, unspecified: Secondary | ICD-10-CM | POA: Diagnosis not present

## 2022-02-25 DIAGNOSIS — R21 Rash and other nonspecific skin eruption: Secondary | ICD-10-CM | POA: Diagnosis not present

## 2022-02-25 NOTE — Progress Notes (Signed)
Follow-Up Visit   Subjective  Tiffany Price is a 61 y.o. female who presents for the following: Follow-up.  Patient presents for 7 day follow-up Patch Testing. She has not noticed any reactions to abdomen where patches were placed. She is using ivermectin cream to spots on right temple and preauricular and taking doxycycline '100mg'$  daily. She feels like these areas are improving some, not as itchy. She has not used Clobetasol/CeraVe mix, but has used Elidel cream some on itchy areas on arms and legs. Rash on back not itchy. She uses only scent-free products. 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   The following portions of the chart were reviewed this encounter and updated as appropriate:       Review of Systems:  No other skin or systemic complaints except as noted in HPI or Assessment and Plan.  Objective  Well appearing patient in no apparent distress; mood and affect are within normal limits.  A focused examination was performed including face, back, arms, legs, abdomen. Relevant physical exam findings are noted in the Assessment and Plan.  arms, legs, back Scattered small pink papules on the back; mild erythema with lichenification on the elbows.  face Pink patch on the right ant neck, jaw bilareral, right zygoma    Assessment & Plan  Rash arms, legs, back  Probable Atopic Dermatitis. Chronic and persistent condition with duration or expected duration over one year. Condition is symptomatic/ bothersome to patient. Improving but not currently at goal.   Atopic dermatitis (eczema) is a chronic, relapsing, pruritic condition that can significantly affect quality of life. It is often associated with allergic rhinitis and/or asthma and can require treatment with topical medications, phototherapy, or in severe cases biologic injectable medication (Dupixent; Adbry) or Oral JAK inhibitors.  7 day follow-up TRUE Patch Test. No reactions noted.  Discussed biopsy,  patient defers today. Rash/itching is manageable with topicals at this time.   Continue pimecrolimus cream qd/bid AAs body/face prn.   Restart clobetasol/CeraVe mix to itchy areas if not improving with pimecrolimus.  Briefly discussed Dupixent injections if not improving with topicals  Recommend mild soap and moisturizing cream 1-2 times daily.  Gentle skin care handout provided.     Related Medications pimecrolimus (ELIDEL) 1 % cream Apply twice daily to face and affected body areas as needed for itching.  clobetasol (TEMOVATE) 0.05 % external solution Patient to mix solution in jar of CeraVe Cream. Apply to affected areas rash twice daily until improved.  Rosacea face  Chronic and persistent condition with duration or expected duration over one year. Condition is symptomatic / bothersome to patient. Improving, but not to goal.  Rosacea is a chronic progressive skin condition usually affecting the face of adults, causing redness and/or acne bumps. It is treatable but not curable. It sometimes affects the eyes (ocular rosacea) as well. It may respond to topical and/or systemic medication and can flare with stress, sun exposure, alcohol, exercise and some foods.  Daily application of broad spectrum spf 30+ sunscreen to face is recommended to reduce flares.  Continue ivermectin cream qhs to aas face Continue doxycycline '100mg'$  PO daily.  Doxycycline should be taken with food to prevent nausea. Do not lay down for 30 minutes after taking. Be cautious with sun exposure and use good sun protection while on this medication. Pregnant women should not take this medication.    Related Medications doxycycline (MONODOX) 100 MG capsule Take 1 capsule (100 mg total) by mouth daily. Take with  food  Ivermectin (SOOLANTRA) 1 % CREA Apply pea size amount to affected area on  Face and neck every night.   Return if symptoms worsen or fail to improve.  IJamesetta Orleans, CMA, am acting as scribe  for Brendolyn Patty, MD .  Documentation: I have reviewed the above documentation for accuracy and completeness, and I agree with the above.  Brendolyn Patty MD

## 2022-02-25 NOTE — Patient Instructions (Addendum)
Atopic Dermatitis  "Dermatitis" means inflammation of the skin.  "Atopic" dermatitis is a particular type of skin inflammation that is marked by dryness, associated itching, and a characteristic pattern of rash on the body.  The condition is fairly common and may occur in as many as 10% of children.  You will often hear it called "atopic eczema" or sometimes just "eczema".  The exact cause of atopic dermatitis is unknown.  In many patients, there is a family history of hay fever, asthma, or atopic dermatitis itself.  Rarely, atopic dermatitis in infants may be related to food sensitivity, such as sensitivity to milk, but this is often difficult to determine and manage.  In the majority of cases, however, no allergic triggers can be found.  Physical or emotional stressors (severe seasonal allergies, physical illness, etc.) can worsen atopic dermatitis.  Atopic dermatitis usually starts in infancy from the ages of 2 to 6 months.  The skin is dry and the rash is quite itchy, so infants may be restless and rub against the sheets or scratch (if able).  The rash may involve the face or it may cover a large part of the body.  As the child gets older, the rash may become more localized.  In early childhood, the rash is commonly on the legs, feet, hands and arms.  As a child becomes older, the rash may be limited to the bend of the elbows, knees, on the back of the hands, feet, and on the neck and face.  When the rash becomes more established, the dry itchy skin may become thickened, leathery and sometimes darker in coloration.  The more the person scratches, the worse the rash is and the thicker the skin gets.  Many children with atopic dermatitis outgrow the condition before school age, while others continue to have problems into adolescence and adulthood.  Many things may affect the severity of the condition.  All patients have sensitive and dry skin.  Many will find that during the winter months when the humidity  is very low, the dryness and itchiness will be worse.  On the other hand, some people are easily irritated by sweat and will find that they have more problems during the summer months.  Most patients note an increase in itching at times when there are sudden changes in temperature.  Other irritants easily affect the skin of a patient with atopic dermatitis.  Use of harsh soaps or detergents and exposure to wool are common problems.  Sometimes atopic dermatitis may become infected by bacteria, yeast or viruses.  This is called "secondary infection".  Bacterial secondary infection is the most common and is often a result of scratching.  The rash gets very red with pus-filled pimples and scabs.  If this occurs, your doctor will prescribe an antibiotic to control the infection.  A more serious complication can be caused by certain viruses.  The "cold sore" virus (herpes simplex) may cause a severe rash.  If this is suspected, immediately contact your doctor.   What can I expect from treatment? Unfortunately, there is no "magic" cure that will always eliminate atopic dermatitis.  The main objective in treating atopic dermatitis is to decrease the skin eruption and relieve the itching.  There are a number of different forms of the medications that are used for atopic dermatitis.  Primarily, topical medications will be used.  Because the skin is excessively dry, moisturizers will be recommended that will effectively decrease the dryness.  Daily bathing is  a useful way to get water into the skin but bathing should be brief (no more than 10 minutes unless otherwise indicated by your physician).  Effective moisturizers (Cetaphil cream or lotion, CeraVe cream or lotion [[employer], [employer], and [employer]], Aquaphor, and plain Vaseline) can be used immediately after the bath or shower to trap moisture within the skin.  It is best to "pat dry" after a bathing and then place your moisturizer (cream or lotion) on your skin.   Cortisone (steroid) is a medicated ointment or cream (eg. triamcinolone, hydrocortisone, desonide, betamethasone, clobetasol) that may also be suggested.  It is very helpful in decreasing the itching and controlling the inflammation.  Your doctor will prescribe a cortisone treatment that is most appropriate for the severity and location of the dermatitis that is to be treated.   Once the affected area clears up, it is best to discontinue the use of the cortisone preparation due to possibility of atrophy (skin thinning), but continue the regular use of moisturizers to try to prevent new areas of dermatitis from occurring.  Of course, if itching or a new rash begins, the cortisone preparation may have to be started again.  Anti-inflammatory creams and ointments which are not steroids such as Protopic and Elidel may also be prescribed.  Certain internal medicines called antihistamines (eg. Atarax, Benadryl, hydroxyzine) may help control itching.  They primarily help with the itching by introducing some drowsiness and allowing you to sleep at night.  Some oral antibiotics are often useful as well for controlling the secondary infection and enable infected dermatitis to be controlled.  Other important forms of treatment: Avoid contact with substances you know to cause itching.  These may include soaps, detergents, certain perfumes, dust, grass, weeds, wools, and other types of scratchy clothing. You may bathe daily.  Use no soap or the minimal amount necessary to get clean.  Always use moisturizer immediately after bathing (within 3 minutes is best).  Avoid very hot or very cold water.  Avoid bubble baths.  When drying with a towel, pat dry and do not rub. Use a mild, unscented soap (Dove, CeraVe Cleanser, Lever 2000, or Cetaphil). Try to keep the temperature and humidity in the home fairly constant.  Use a bedroom air conditioner in the summer and a humidifier in the winter.  It is very important that the  humidifier be cleaned frequently and thoroughly since mold may grow and cause allergies. Try to avoid scratching.  Atopic dermatitis is often called "the itch that rashes" and it is known that scratching plays a significant role in making atopic dermatitis worse.  Keeping the nails short and well-filed is helpful. Use a fragrance-free, sensitive skin laundry detergent (eg. All Free & Clear).  Run clothes through a second rinse cycle to remove any residual detergents and chemicals.  Bed linens and towels should be washed in hot water to kill dust mites, which are common allergen in atopic patients. In the bedroom, minimize rugs and curtains or other loose fabrics that collect dust.  The National Eczema Association (www.eczema-assn.org) is a wonderful organization that sends out a Mudlogger with useful information on these types of conditions. Please consider contacting them at the above website or by address: National Eczema Association for Science and Education, [place], [place], [place], [place]   Topical steroids (such as triamcinolone, fluocinolone, fluocinonide, mometasone, clobetasol, halobetasol, betamethasone, hydrocortisone) can cause thinning and lightening of the skin if they are used for too long in the same  area. Your physician has selected the right strength medicine for your problem and area affected on the body. Please use your medication only as directed by your physician to prevent side effects.    Due to recent changes in healthcare laws, you may see results of your pathology and/or laboratory studies on MyChart before the doctors have had a chance to review them. We understand that in some cases there may be results that are confusing or concerning to you. Please understand that not all results are received at the same time and often the doctors may need to interpret multiple results in order to provide you with the best plan of care or course of treatment.  Therefore, we ask that you please give [place] 2 business days to thoroughly review all your results before contacting the office for clarification. Should we see a critical lab result, you will be contacted sooner.   If You Need Anything After Your Visit  If you have any questions or concerns for your doctor, please call our main line at [phone] and press option 4 to reach your doctor's medical assistant. If no one answers, please leave a voicemail as directed and we will return your call as soon as possible. Messages left after 4 pm will be answered the following business day.   You may also send [place] a message via [place]. We typically respond to MyChart messages within 1-2 business days.  For prescription refills, please ask your pharmacy to contact our office. Our fax number is [phone].  If you have an urgent issue when the clinic is closed that cannot wait until the next business day, you can page your doctor at the number below.    Please note that while we do our best to be available for urgent issues outside of office hours, we are not available 24/7.   If you have an urgent issue and are unable to reach [place], you may choose to seek medical care at your doctor's office, retail clinic, urgent care center, or emergency room.  If you have a medical emergency, please immediately call 911 or go to the emergency department.  Pager Numbers  - Dr. Nehemiah Massed: [phone]  - Dr. Laurence Ferrari: [phone]  - Dr. Nicole Kindred: [phone]  In the event of inclement weather, please call our main line at [phone] for an update on the status of any delays or closures.  Dermatology Medication Tips: Please keep the boxes that topical medications come in in order to help keep track of the instructions about where and how to use these. Pharmacies typically print the medication instructions only on the boxes and not directly on the medication tubes.   If your medication is too expensive, please  contact our office at [phone] option 4 or send [place] a message through [place].   We are unable to tell what your co-pay for medications will be in advance as this is different depending on your insurance coverage. However, we may be able to find a substitute medication at lower cost or fill out paperwork to get insurance to cover a needed medication.   If a prior authorization is required to get your medication covered by your insurance company, please allow [place] 1-2 business days to complete this process.  Drug prices often vary depending on where the prescription is filled and some pharmacies may offer cheaper prices.  The website www.goodrx.com contains coupons for medications through different pharmacies. The prices here do not account for what the cost may be with help  from insurance (it may be cheaper with your insurance), but the website can give you the price if you did not use any insurance.  - You can print the associated coupon and take it with your prescription to the pharmacy.  - You may also stop by our office during regular business hours and pick up a GoodRx coupon card.  - If you need your prescription sent electronically to a different pharmacy, notify our office through [facility] or by phone at [phone] option 4.     Si Usted Necesita Algo Despus de Su Visita  Tambin puede enviarnos un mensaje a travs de Pharmacist, community. Por lo general respondemos a los mensajes de MyChart en el transcurso de 1 a 2 das hbiles.  Para renovar recetas, por favor pida a su farmacia que se ponga en contacto con nuestra oficina. Harland Dingwall de fax es [place] [phone].  Si tiene un asunto urgente cuando la clnica est cerrada y que no puede esperar hasta el siguiente da hbil, puede llamar/localizar a su doctor(a) al nmero que aparece a continuacin.   Por favor, tenga en cuenta que aunque hacemos todo lo posible para estar disponibles para asuntos urgentes fuera del horario de  [place], no estamos disponibles las 24 horas del da, los 7 das de la [place].   Si tiene un problema urgente y no puede comunicarse con nosotros, puede optar por buscar atencin mdica  en el consultorio de su doctor(a), en una clnica privada, en un centro de atencin urgente o en una sala de emergencias.  Si tiene Engineering geologist, por favor llame inmediatamente al 911 o vaya a la sala de emergencias.  Nmeros de bper  - Dr. Nehemiah Massed: [phone]  - Dra. Moye: [phone]  - Dra. Nicole Kindred: [phone]  En caso de inclemencias del [place], por favor llame a Johnsie Kindred principal al [phone] para una actualizacin sobre el [place] de cualquier retraso o cierre.  Consejos para la medicacin en dermatologa: Por favor, guarde las cajas en las que vienen los medicamentos de uso tpico para ayudarle a seguir las instrucciones sobre dnde y cmo usarlos. Las farmacias generalmente imprimen las instrucciones del medicamento slo en las cajas y no directamente en los tubos del [place].   Si su medicamento es muy caro, por favor, pngase en contacto con Zigmund Daniel llamando al [phone] y presione la opcin 4 o envenos un mensaje a travs de Pharmacist, community.   No podemos decirle cul ser su copago por los medicamentos por adelantado ya que esto es diferente dependiendo de la cobertura de su seguro. Sin embargo, es posible que podamos encontrar un medicamento sustituto a Electrical engineer un formulario para que el seguro cubra el medicamento que se considera necesario.   Si se requiere una autorizacin previa para que su compaa de seguros [place] su medicamento, por favor permtanos de 1 a 2 das hbiles para completar este proceso.  Los precios de los medicamentos varan con frecuencia dependiendo del Environmental consultant de dnde se surte la receta y alguna farmacias pueden ofrecer precios ms baratos.  El sitio web www.goodrx.com tiene cupones para medicamentos de Airline pilot. Los  precios aqu no tienen en cuenta lo que podra costar con la ayuda del seguro (puede ser ms barato con su seguro), pero el sitio web puede darle el precio si no utiliz Research scientist (physical sciences).  - Puede imprimir el cupn correspondiente y llevarlo con su receta a la farmacia.  - Tambin puede pasar por nuestra oficina durante el horario  de atencin regular y Charity fundraiser una tarjeta de cupones de GoodRx.  - Si necesita que su receta se enve electrnicamente a una farmacia diferente, informe a nuestra oficina a travs de MyChart de [place] o por telfono llamando al [phone] y presione la opcin 4.

## 2023-07-07 ENCOUNTER — Ambulatory Visit: Payer: Medicare PPO | Admitting: Dermatology

## 2023-07-07 ENCOUNTER — Encounter: Payer: Medicare PPO | Admitting: Dermatology

## 2023-07-07 DIAGNOSIS — L720 Epidermal cyst: Secondary | ICD-10-CM | POA: Diagnosis not present

## 2023-07-07 DIAGNOSIS — D492 Neoplasm of unspecified behavior of bone, soft tissue, and skin: Secondary | ICD-10-CM

## 2023-07-07 NOTE — Progress Notes (Signed)
   Follow-Up Visit   Subjective  Tiffany Price is a 63 y.o. female who presents for the following: cyst chest, 20 yrs, has gotten harder over past 2 yrs, painful The patient has spots, moles and lesions to be evaluated, some may be new or changing and the patient may have concern these could be cancer.   The following portions of the chart were reviewed this encounter and updated as appropriate: medications, allergies, medical history  Review of Systems:  No other skin or systemic complaints except as noted in HPI or Assessment and Plan.  Objective  Well appearing patient in no apparent distress; mood and affect are within normal limits.   A focused examination was performed of the following areas: chest  Relevant exam findings are noted in the Assessment and Plan.  Central upper abdomen 3.5cm sub q nodule   Assessment & Plan        NEOPLASM OF SKIN Central upper abdomen Skin excision  Lesion length (cm):  3.5 Lesion width (cm):  3.5 Margin per side (cm):  0.1 Total excision diameter (cm):  3.7 Informed consent: discussed and consent obtained   Timeout: patient name, date of birth, surgical site, and procedure verified   Procedure prep:  Patient was prepped and draped in usual sterile fashion Prep type:  Povidone-iodine Anesthesia: the lesion was anesthetized in a standard fashion   Anesthetic:  1% lidocaine w/ epinephrine 1-100,000 buffered w/ 8.4% NaHCO3 (12cc lido w/ epi, 7cc bupivicaine, Total of 19cc) Instrument used: #15 blade   Hemostasis achieved with: pressure and electrodesiccation   Outcome: patient tolerated procedure well with no complications    Skin repair Complexity:  Intermediate Final length (cm):  3.2 Informed consent: discussed and consent obtained   Reason for type of repair: reduce tension to allow closure, reduce the risk of dehiscence, infection, and necrosis, reduce subcutaneous dead space and avoid a hematoma, preserve normal  anatomical and functional relationships and enhance both functionality and cosmetic results   Undermining: edges undermined   Subcutaneous layers (deep stitches):  Suture size:  3-0 Suture type: Vicryl (polyglactin 910)   Subcutaneous suture technique: inverted dermal. Fine/surface layer approximation (top stitches):  Suture size:  4-0 Suture type: nylon   Stitches: simple interrupted   Suture removal (days):  7 Hemostasis achieved with: suture Outcome: patient tolerated procedure well with no complications   Post-procedure details: sterile dressing applied and wound care instructions given   Dressing type: pressure dressing (Mupirocin ointment)   Specimen 1 - Surgical pathology Differential Diagnosis: D48.5 Cyst vs Other  Check Margins: No 3.5cm sub q nodule Cyst vs other excised today  Return in about 1 week (around 07/14/2023) for suture removal.  I, Sonya Hupman, RMA, am acting as scribe for Willeen Niece, MD .   Documentation: I have reviewed the above documentation for accuracy and completeness, and I agree with the above.  Willeen Niece, MD

## 2023-07-07 NOTE — Patient Instructions (Addendum)

## 2023-07-08 ENCOUNTER — Telehealth: Payer: Self-pay

## 2023-07-08 NOTE — Telephone Encounter (Signed)
Left pt msg to call if any problems after yesterday's surgery.Marguerite Olea

## 2023-07-09 LAB — SURGICAL PATHOLOGY

## 2023-07-14 ENCOUNTER — Ambulatory Visit (INDEPENDENT_AMBULATORY_CARE_PROVIDER_SITE_OTHER): Payer: Medicare PPO | Admitting: Dermatology

## 2023-07-14 DIAGNOSIS — L729 Follicular cyst of the skin and subcutaneous tissue, unspecified: Secondary | ICD-10-CM

## 2023-07-14 DIAGNOSIS — L720 Epidermal cyst: Secondary | ICD-10-CM

## 2023-07-14 NOTE — Progress Notes (Signed)
   Follow-Up Visit   Subjective  Tiffany Price is a 63 y.o. female who presents for the following: Suture removal  Pathology showed benign epidermoid cyst.   The following portions of the chart were reviewed this encounter and updated as appropriate: medications, allergies, medical history  Review of Systems:  No other skin or systemic complaints except as noted in HPI or Assessment and Plan.  Objective  Well appearing patient in no apparent distress; mood and affect are within normal limits.  Areas Examined: Abdomen  Relevant physical exam findings are noted in the Assessment and Plan.    Assessment & Plan    Encounter for Removal of Sutures - Incision site is clean, dry and intact. - Wound cleansed, sutures removed, wound cleansed and steri strips applied.  - Discussed pathology results showing epidermoid cyst - Patient advised to keep steri-strips dry until they fall off. - Scars remodel for a full year. - Once steri-strips fall off, patient can apply over-the-counter silicone scar cream once to twice a day to help with scar remodeling if desired. - Patient advised to call with any concerns or if they notice any new or changing lesions.  Return if symptoms worsen or fail to improve.  ICherlyn Labella, CMA, am acting as scribe for Willeen Niece, MD .   Documentation: I have reviewed the above documentation for accuracy and completeness, and I agree with the above.  Willeen Niece, MD

## 2023-07-14 NOTE — Patient Instructions (Signed)

## 2023-11-24 ENCOUNTER — Encounter: Payer: Medicare PPO | Admitting: Dermatology
